# Patient Record
Sex: Female | Born: 1955 | Race: White | Hispanic: No | Marital: Married
Health system: Midwestern US, Academic
[De-identification: ages and names within clinical notes are randomized; demographics above are authoritative.]

---

## 2016-07-29 MED ORDER — AZITHROMYCIN 250 MG PO TAB
ORAL_TABLET | Freq: Every day | 0 refills | Status: DC
Start: 2016-07-29 — End: 2016-08-13

## 2016-07-29 MED ORDER — ALBUTEROL SULFATE 90 MCG/ACTUATION IN HFAA
2 | RESPIRATORY_TRACT | 0 refills | Status: DC | PRN
Start: 2016-07-29 — End: 2016-08-18

## 2016-07-29 MED ORDER — ALBUTEROL SULFATE 2.5 MG /3 ML (0.083 %) IN NEBU
2.5 mg | Freq: Once | RESPIRATORY_TRACT | 0 refills | Status: CP
Start: 2016-07-29 — End: ?

## 2016-07-29 MED ORDER — PREDNISONE 20 MG PO TAB
ORAL_TABLET | Freq: Every day | 0 refills | Status: DC
Start: 2016-07-29 — End: 2016-08-13

## 2016-08-13 MED ORDER — TIZANIDINE 4 MG PO TAB
ORAL_TABLET | Freq: Every evening | 5 refills | Status: DC
Start: 2016-08-13 — End: 2017-04-28

## 2016-08-26 MED ORDER — SODIUM CHLORIDE 0.9 % IV SOLP
1000 mL | INTRAVENOUS | 0 refills | Status: DC
Start: 2016-08-26 — End: 2016-08-26

## 2016-08-26 MED ORDER — FENTANYL CITRATE (PF) 50 MCG/ML IJ SOLN
0 refills | Status: DC
Start: 2016-08-26 — End: 2016-08-26

## 2016-08-26 MED ORDER — PROPOFOL INJ 10 MG/ML IV VIAL
0 refills | Status: DC
Start: 2016-08-26 — End: 2016-08-26

## 2016-08-26 MED ORDER — KETOROLAC 30 MG/ML (1 ML) IJ SOLN
0 refills | Status: DC
Start: 2016-08-26 — End: 2016-08-26

## 2016-08-26 MED ORDER — HYDROCODONE-ACETAMINOPHEN 5-325 MG PO TAB
1 | ORAL_TABLET | ORAL | 0 refills | 30.00000 days | Status: DC | PRN
Start: 2016-08-26 — End: 2016-12-29

## 2016-08-26 MED ORDER — LIDOCAINE (PF) 200 MG/10 ML (2 %) IJ SYRG
0 refills | Status: DC
Start: 2016-08-26 — End: 2016-08-26

## 2016-08-26 MED ORDER — IBUPROFEN 600 MG PO TAB
600 mg | ORAL_TABLET | ORAL | 0 refills | Status: DC | PRN
Start: 2016-08-26 — End: 2019-03-18

## 2016-08-26 MED ORDER — HALOPERIDOL LACTATE 5 MG/ML IJ SOLN
1 mg | Freq: Once | INTRAVENOUS | 0 refills | Status: CP | PRN
Start: 2016-08-26 — End: ?

## 2016-08-26 MED ORDER — DEXAMETHASONE SODIUM PHOSPHATE 4 MG/ML IJ SOLN
INTRAVENOUS | 0 refills | Status: DC
Start: 2016-08-26 — End: 2016-08-26

## 2016-08-26 MED ORDER — LIDOCAINE (PF) 10 MG/ML (1 %) IJ SOLN
.1-2 mL | INTRAMUSCULAR | 0 refills | Status: DC | PRN
Start: 2016-08-26 — End: 2016-08-26

## 2016-08-26 MED ORDER — MIDAZOLAM 1 MG/ML IJ SOLN
INTRAVENOUS | 0 refills | Status: DC
Start: 2016-08-26 — End: 2016-08-26

## 2016-08-26 MED ORDER — PHENYLEPHRINE IN 0.9% NACL(PF) 1 MG/10 ML (100 MCG/ML) IV SYRG
INTRAVENOUS | 0 refills | Status: DC
Start: 2016-08-26 — End: 2016-08-26

## 2016-08-26 MED ORDER — ACETAMINOPHEN 500 MG PO TAB
1000 mg | Freq: Once | ORAL | 0 refills | Status: CP
Start: 2016-08-26 — End: ?

## 2016-08-26 MED ORDER — FENTANYL CITRATE (PF) 50 MCG/ML IJ SOLN
50 ug | INTRAVENOUS | 0 refills | Status: DC | PRN
Start: 2016-08-26 — End: 2016-08-26

## 2016-08-26 MED ORDER — ONDANSETRON HCL (PF) 4 MG/2 ML IJ SOLN
INTRAVENOUS | 0 refills | Status: DC
Start: 2016-08-26 — End: 2016-08-26

## 2016-08-26 MED ORDER — DEXTRAN 70-HYPROMELLOSE 0.1-0.3 % OP DROP
0 refills | Status: DC
Start: 2016-08-26 — End: 2016-08-26

## 2016-08-26 MED ORDER — OXYCODONE 5 MG PO TAB
5 mg | Freq: Once | ORAL | 0 refills | Status: CP | PRN
Start: 2016-08-26 — End: ?

## 2016-09-08 ENCOUNTER — Encounter: Admit: 2016-09-08 | Discharge: 2016-09-08 | Payer: BC Managed Care – HMO

## 2016-09-08 MED ORDER — ZOLPIDEM 5 MG PO TAB
ORAL_TABLET | Freq: Every evening | ORAL | 0 refills | 30.00000 days | Status: AC | PRN
Start: 2016-09-08 — End: 2016-11-12

## 2016-09-08 NOTE — Telephone Encounter
Approved #[id number] no refill as phone in

## 2016-09-08 NOTE — Telephone Encounter
Medication refill request from [facility] for zolpidem.  Last refill was 05/15/2016 (#30 X1).   Last office visit was 08/13/2016, next office visit is 11/27/2016.   Medication is not under standing order protocol.   Routing to Dr. Jonette Mate for approval.    Jonathon Bellows, LPN

## 2016-09-09 NOTE — Telephone Encounter
Prescription phoned into patients [facility].  Yaa Donnellan Stehle, LPN

## 2016-09-10 ENCOUNTER — Encounter: Admit: 2016-09-10 | Discharge: 2016-09-10 | Payer: BC Managed Care – HMO

## 2016-09-12 NOTE — Telephone Encounter
Medication refill request from [facility] for xarelto.  Last refill was 03/17/2016 (#90 X1).   Last office visit was 08/13/2016, next office visit is 11/27/2016.   Medication is not under standing order protocol.   Routing to Dr. Jonette Mate for approval.    Jonathon Bellows, LPN

## 2016-09-13 MED ORDER — XARELTO 20 MG PO TAB
ORAL_TABLET | Freq: Every day | ORAL | 1 refills | 30.00000 days | Status: AC
Start: 2016-09-13 — End: 2017-03-13

## 2016-09-13 NOTE — Telephone Encounter
approved

## 2016-09-18 ENCOUNTER — Emergency Department
Admit: 2016-09-18 | Discharge: 2016-09-19 | Disposition: A | Discharge status: Home / Self Care | Payer: BC Managed Care – HMO

## 2016-09-18 ENCOUNTER — Encounter: Admit: 2016-09-18 | Discharge: 2016-09-18 | Payer: BC Managed Care – HMO

## 2016-09-18 DIAGNOSIS — G43909 Migraine, unspecified, not intractable, without status migrainosus: ICD-10-CM

## 2016-09-18 DIAGNOSIS — M1990 Unspecified osteoarthritis, unspecified site: ICD-10-CM

## 2016-09-18 DIAGNOSIS — H04123 Dry eye syndrome of bilateral lacrimal glands: ICD-10-CM

## 2016-09-18 DIAGNOSIS — I82409 Acute embolism and thrombosis of unspecified deep veins of unspecified lower extremity: ICD-10-CM

## 2016-09-18 DIAGNOSIS — G35 Multiple sclerosis: ICD-10-CM

## 2016-09-18 DIAGNOSIS — H47299 Other optic atrophy, unspecified eye: Principal | ICD-10-CM

## 2016-09-18 DIAGNOSIS — G4733 Obstructive sleep apnea (adult) (pediatric): ICD-10-CM

## 2016-09-18 DIAGNOSIS — K219 Gastro-esophageal reflux disease without esophagitis: ICD-10-CM

## 2016-09-18 DIAGNOSIS — E785 Hyperlipidemia, unspecified: ICD-10-CM

## 2016-09-18 DIAGNOSIS — R011 Cardiac murmur, unspecified: ICD-10-CM

## 2016-09-18 DIAGNOSIS — R51 Headache: ICD-10-CM

## 2016-09-18 MED ORDER — CAFFEINE 200 MG PO TAB
200 mg | Freq: Once | ORAL | 0 refills | Status: DC
Start: 2016-09-18 — End: 2016-09-19

## 2016-09-18 MED ORDER — ACETAMINOPHEN 500 MG PO TAB
1000 mg | Freq: Once | ORAL | 0 refills | Status: CP
Start: 2016-09-18 — End: ?
  Administered 2016-09-19: 01:00:00 1000 mg via ORAL

## 2016-09-18 MED ORDER — FLUORESCEIN-PROPARACAINE OP DROP
1 [drp] | Freq: Once | OPHTHALMIC | 0 refills | Status: CP
Start: 2016-09-18 — End: ?
  Administered 2016-09-19: 1 [drp] via OPHTHALMIC

## 2016-09-18 MED ORDER — SODIUM CHLORIDE 0.9 % IV SOLP
1000 mL | INTRAVENOUS | 0 refills | Status: CP
Start: 2016-09-18 — End: ?
  Administered 2016-09-19: 02:00:00 1000 mL via INTRAVENOUS

## 2016-09-18 MED ORDER — PROCHLORPERAZINE EDISYLATE 5 MG/ML IJ SOLN
10 mg | Freq: Once | INTRAVENOUS | 0 refills | Status: CP
Start: 2016-09-18 — End: ?
  Administered 2016-09-19: 02:00:00 10 mg via INTRAVENOUS

## 2016-09-18 NOTE — ED Notes
Pt to ED for evaluation of a right sided headache that started this morning accompanied with a blood shot eye. Pt denies fall, injury, cough, fevers, SP, SOB. Pt denies being outside today or starting any new medications, food, or eye drops. Pt denies putting on eye makeup. Pt has redness on the white of her right eye and slight swelling under her eye. Pt alert and oriented, breathing non labored and even, lungs CTA, skin color appropriate to age and ethnicity. Pupils round and reactive. Pt resting on bed, call light within reach, awaiting MD evaluation at this time.     Belongings: purse, shirt, pants, socks, shoes, glasses. Pt denies RN documenting any other belongings at this time.

## 2016-09-19 DIAGNOSIS — G43809 Other migraine, not intractable, without status migrainosus: Principal | ICD-10-CM

## 2016-09-19 NOTE — ED Notes
Pt given discharge instructions and denies any questions or concerns at this time.

## 2016-09-19 NOTE — Progress Notes
A. Endometrial curettings:    Benign endocervical glandular mucosa with rare atypical cells. See   comment.     Comment:   Immunohistochemical stain show the atypical cells are negative/weak   positive for p16. No definite dysplasia or malignancy identified. 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     The patient'srecent Pap smear ([id number]) with an interpretation of   "negative for intraepithelial lesion or malignancy" is noted; the findings   in the current ECC specimen correlate with the Pap smear result.     Pursuant to the Quality Assurance Program at the [facility] Pathology Department, selected slides from this case have been   concurrently reviewed by the following pathologist: Dr. Carmon Ginsberg who agrees   with the final diagnosis.      Attestation:   By this signature, I attest that I have personally formulated the final   interpretation expressed in this report and that the above diagnosis is   based upon my examination of the slides and/or other material indicated in   this report.     +++Electronically Signed Out By Zachary George, MD, PhD on 08/28/2016+++          ksw/08/27/2016

## 2016-09-22 ENCOUNTER — Encounter: Admit: 2016-09-22 | Discharge: 2016-09-22 | Payer: BC Managed Care – HMO

## 2016-09-22 NOTE — Telephone Encounter
ED Discharge Follow Up  Reached patient: No; LM to contact PCP office with any questions/concerns, or to make f/u appt.  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 No, routine appt 11/27/16  Specialist appointment scheduled? No  Both PCP and Specialist appointment scheduled: No

## 2016-11-12 ENCOUNTER — Encounter: Admit: 2016-11-12 | Discharge: 2016-11-12 | Payer: BC Managed Care – HMO

## 2016-11-12 MED ORDER — ZOLPIDEM 5 MG PO TAB
5 mg | ORAL_TABLET | Freq: Every evening | ORAL | 0 refills | 30.00000 days | Status: AC | PRN
Start: 2016-11-12 — End: 2017-07-02

## 2016-11-12 MED ORDER — ATORVASTATIN 10 MG PO TAB
10 mg | ORAL_TABLET | Freq: Every evening | ORAL | 1 refills | Status: AC
Start: 2016-11-12 — End: 2017-05-25

## 2016-11-12 NOTE — Telephone Encounter
Refill request received from pt for atorvastatin.  Last refill was 05/19/2016 ( #90 x1).  This medication is under standing order protocol.     Hepatic Function    Lab Results   Component Value Date/Time    ALBUMIN 4.1 08/16/2015 05:51 PM    TOTPROT 6.9 08/16/2015 05:51 PM    ALKPHOS 92 08/16/2015 05:51 PM    Lab Results   Component Value Date/Time    AST 18 08/16/2015 05:51 PM    ALT 13 08/16/2015 05:51 PM    TOTBILI 0.4 08/16/2015 05:51 PM        Medication refill request from pt for zolpidem.  Last refill was 09/08/2016 (#30 X0).   Last office visit was 08/13/2016, next office visit is 11/27/2016.   Medication is not under standing order protocol.   Routing to Dr. Jonette Mate for approval.    Jonathon Bellows, LPN

## 2016-11-12 NOTE — Telephone Encounter
approved

## 2016-11-13 NOTE — Telephone Encounter
Prescription phoned into patients [facility] pharmacy.  Shaunn Tackitt Stehle, LPN

## 2016-11-27 ENCOUNTER — Encounter: Admit: 2016-11-27 | Discharge: 2016-11-27 | Payer: BC Managed Care – HMO

## 2016-11-27 ENCOUNTER — Ambulatory Visit: Admit: 2016-11-27 | Discharge: 2016-11-27 | Payer: BC Managed Care – HMO

## 2016-11-27 DIAGNOSIS — I82409 Acute embolism and thrombosis of unspecified deep veins of unspecified lower extremity: ICD-10-CM

## 2016-11-27 DIAGNOSIS — H04123 Dry eye syndrome of bilateral lacrimal glands: ICD-10-CM

## 2016-11-27 DIAGNOSIS — H47299 Other optic atrophy, unspecified eye: Principal | ICD-10-CM

## 2016-11-27 DIAGNOSIS — K219 Gastro-esophageal reflux disease without esophagitis: ICD-10-CM

## 2016-11-27 DIAGNOSIS — N858 Other specified noninflammatory disorders of uterus: ICD-10-CM

## 2016-11-27 DIAGNOSIS — Z0000 Encounter for general adult medical examination without abnormal findings: ICD-10-CM

## 2016-11-27 DIAGNOSIS — R011 Cardiac murmur, unspecified: ICD-10-CM

## 2016-11-27 DIAGNOSIS — G35 Multiple sclerosis: Principal | ICD-10-CM

## 2016-11-27 DIAGNOSIS — E785 Hyperlipidemia, unspecified: ICD-10-CM

## 2016-11-27 DIAGNOSIS — G43909 Migraine, unspecified, not intractable, without status migrainosus: ICD-10-CM

## 2016-11-27 DIAGNOSIS — M1990 Unspecified osteoarthritis, unspecified site: ICD-10-CM

## 2016-11-27 DIAGNOSIS — G4733 Obstructive sleep apnea (adult) (pediatric): ICD-10-CM

## 2016-11-27 NOTE — Progress Notes
Subjective:       History of Present Illness  Sandra Ortega is a 61 y.o. female here for follow up      Current concerns:  - has a mammogram next Thursday, mobile unit which come to her work.  - no change with MS.  Seeing Dr. Burnadette Peter. Gets throbbing in her leg and takes Ambien at night if needed.  Does not take the Ambien nightly.  Some nights takes OTC sleep aid.  Does not think this is an insomnia problem, more related to the leg pain, throbbing in her leg.  If she does not get a good night of sleep, she feels bad during the day.    - does quite a bit of walking.  Uses an app that tracks diet and exercise.    - lost 5-6 pounds through diet and exercise.  - eye issues are unrelated to MS, getting care through ophthalmology      Recent history:  - in the ED 09/18/16 with red eye and headache. Had thorough evaluation that included slit lamp, fluorescein, ophthalmology evaluation.possible vitreous irritation.  - multiple sclerosis, not currently on immunosuppressive medications.   - recurrent DVT, on Xarelto  - endometrial polyp, had polypectomy.      Social: Husband is anticipating surgery soon      HCM  - PE  - shingles info  - HIV         Review of Systems  Negative for chest pain, shortness of breath, abdominal pain, difficulty urinating.  Positive for leg pain.  Positive for fatigue.  Positive for some weakness.    Objective:         ??? aspirin/acetaminophen/caffeine(+) (EXCEDRIN MIGRAINE) 250/250/65 mg tab Take 2 Tabs by mouth daily as needed (migraines).   ??? atorvastatin (LIPITOR) 10 mg tablet Take 1 tablet by mouth at bedtime daily.   ??? baclofen (LIORESAL) 10 mg tablet TAKE 1 TABLET BY MOUTH 4 TIMES A DAY (Patient taking differently: take one daily if needed)   ??? calcium carbonate/vitamin D-3 (OSCAL-500+D) 1250 mg/200 unit tablet Take 1 tablet by mouth twice daily. Calcium Carb 1250mg  delivers 500mg  elemental Ca   ??? Carboxymethylcellulose Sodium (REFRESH LIQUIGEL) 1 % dlgl Apply 1 drop to both eyes twice daily.   ??? Cholecalciferol (Vitamin D3) 2,000 unit cap Take 2,000 Units by mouth at bedtime daily.   ??? fish oil- omega 3-DHA/EPA 300/1,000 mg capsule Take 1 capsule by mouth at bedtime daily.   ??? HYDROcodone/acetaminophen (NORCO) 5/325 mg tablet Take 1 tablet by mouth every 4 hours as needed for Pain   ??? ibuprofen (MOTRIN) 600 mg tablet Take 1 tablet by mouth every 6 hours as needed for Pain. Take with food.   ??? lactulose 10 gram/15 mL oral solution Take 15 mL by mouth daily as needed.   ??? pantoprazole DR (PROTONIX) 40 mg tablet Take 1 tablet by mouth twice daily. (Patient taking differently: Take 40 mg by mouth at bedtime daily.)   ??? prednisolone acetate (PRED FORTE) 1 % ophthalmic suspension INSTILL 1 DROP INTO RIGHT EYE 4X /DAY X 1 WK, 3X /DAY X 1 WK, TWICE DAILY X 1 WK, ONCE DAILY X 1 WK   ??? tiZANidine (ZANAFLEX) 4 mg tablet TAKE 2 TABLETS BY MOUTH AT BEDTIME   ??? XARELTO 20 mg tablet TAKE 1 TABLET BY MOUTH EVERY DAY   ??? zolpidem (AMBIEN) 5 mg tablet Take 1 tablet by mouth at bedtime as needed for Sleep.     Vitals:  11/27/16 0813   BP: 116/78   Pulse: 80   Temp: 36.7 ???C (98.1 ???F)   TempSrc: Oral   Weight: 101.9 kg (224 lb 9.6 oz)   Height: 162.6 cm (64.02)     Body mass index is 38.53 kg/m???.     Physical Exam  Looks well, no distress.  Able to get up on the exam table without difficulty.  Blood pressure 116/78.  Lungs sound clear, heart regular abdomen is soft and nontender.  Extremities are not edematous.  Good range of motion of her legs.  No acute synovitis.  Strength is equal.       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  ???Clinically stable, not on immunosuppressive therapy at present.  Followed with Dr. Burnadette Peter.  ???Some fatigue and throbbing pain in her right leg which may be due to a combination of the MS and the, sciatica..  Seems to do fairly well with occasional Ambien, tizanidine or baclofen.  Takes all of these medications very sparingly.     Recurrent Deep Vein Thrombosis (Dvt) (Hcc)    11/22/15 getting records, but patient thinks she was told she needs life long anticoagulation for two prior DVT of the right leg. On Xarelto.  08/13/16 clinically stable.  Anticoagulated with Xarelto.  Had a little bit of bleeding in her mouth during the night a couple times but currently there are no abnormalities seen on her mucous membranes.  Will continue to follow.  Discussed perioperative management of her Xarelto for upcoming gynecologic procedure.  today  doing well with the Xarelto.  Continue same.     Uterine Mass    - 2015 MRI of L-spine possible uterine lesion  05/16/16 reviewed images with patient.  Pelvic sono ordered.  08/13/16 has endometrial polyp and having hysteroscope, D&C, polypectomy soon.  Discussed need to hold Xarelto for the 2 days prior to the procedure as well as the day of the procedure.  I did not think we needed any bridging Lovenox.  It has been a number of years since her last DVT.  today  reviewed the notes from gynecology.  Had a hysteroscope a few months ago and did well with the procedure.  Biopsy showed a few atypical cells.  Needs follow-up with gynecology in May.  This was discussed with the patient.           Insomnia/right leg pain,  - continue same. Doing ok with occasional Ambien, tizanidine or baclofen.      RTC 6 months and prn

## 2016-12-11 ENCOUNTER — Encounter: Admit: 2016-12-11 | Discharge: 2016-12-11 | Payer: BC Managed Care – HMO

## 2016-12-29 ENCOUNTER — Encounter: Admit: 2016-12-29 | Discharge: 2016-12-29 | Payer: BC Managed Care – HMO

## 2016-12-29 ENCOUNTER — Ambulatory Visit: Admit: 2016-12-29 | Discharge: 2016-12-29 | Payer: BC Managed Care – HMO

## 2016-12-29 DIAGNOSIS — H04123 Dry eye syndrome of bilateral lacrimal glands: ICD-10-CM

## 2016-12-29 DIAGNOSIS — G35 Multiple sclerosis: ICD-10-CM

## 2016-12-29 DIAGNOSIS — H47299 Other optic atrophy, unspecified eye: Principal | ICD-10-CM

## 2016-12-29 DIAGNOSIS — G43909 Migraine, unspecified, not intractable, without status migrainosus: ICD-10-CM

## 2016-12-29 DIAGNOSIS — M79605 Pain in left leg: Principal | ICD-10-CM

## 2016-12-29 DIAGNOSIS — G4733 Obstructive sleep apnea (adult) (pediatric): ICD-10-CM

## 2016-12-29 DIAGNOSIS — K219 Gastro-esophageal reflux disease without esophagitis: ICD-10-CM

## 2016-12-29 DIAGNOSIS — M1990 Unspecified osteoarthritis, unspecified site: ICD-10-CM

## 2016-12-29 DIAGNOSIS — R011 Cardiac murmur, unspecified: ICD-10-CM

## 2016-12-29 DIAGNOSIS — I82409 Acute embolism and thrombosis of unspecified deep veins of unspecified lower extremity: ICD-10-CM

## 2016-12-29 DIAGNOSIS — E785 Hyperlipidemia, unspecified: ICD-10-CM

## 2016-12-29 NOTE — Progress Notes
CC: leg pain     History of Present Illness  Sandra Ortega is a 61 y.o. female who presents with one-week history of left sided leg pain.  It started behind the left knee.  She does not recall any inciting event.  She does have radiation towards her foot and the back of the calf.  It comes and goes.  It is worse with walking up the stairs.  She has done some at home remedies including heat, ice, ibuprofen with moderate relief.  She is very concerned she might have a blood clot.  She does have a history of DVT in the right leg for which she does take Xarelto.  She is very religious about her medication and does not miss any doses.  She does have intermittent clicking in the left knee as well.  She denies any chest pain or shortness of breath.    Of note her daughter who is becoming a physician is away for a rotation and she is caring for her 3 grandchildren.  Their father is a Emergency planning/management officer that works in the nights.     Review of Systems   Constitutional: Negative for fatigue.   Eyes: Negative for visual disturbance.   Respiratory: Negative for cough and shortness of breath.    Cardiovascular: Negative for chest pain.   Musculoskeletal: Positive for arthralgias and gait problem. Negative for joint swelling.   Skin: Negative for color change and rash.   Neurological: Negative for dizziness, syncope, weakness, light-headedness, numbness and headaches.   Psychiatric/Behavioral: Negative for dysphoric mood.         Objective:         ??? aspirin/acetaminophen/caffeine(+) (EXCEDRIN MIGRAINE) 250/250/65 mg tab Take 2 Tabs by mouth daily as needed (migraines).   ??? atorvastatin (LIPITOR) 10 mg tablet Take 1 tablet by mouth at bedtime daily.   ??? baclofen (LIORESAL) 10 mg tablet TAKE 1 TABLET BY MOUTH 4 TIMES A DAY (Patient taking differently: take one daily if needed)   ??? calcium carbonate/vitamin D-3 (OSCAL-500+D) 1250 mg/200 unit tablet Take 1 tablet by mouth twice daily. Calcium Carb 1250mg  delivers 500mg  elemental Ca ??? Carboxymethylcellulose Sodium (REFRESH LIQUIGEL) 1 % dlgl Apply 1 drop to both eyes twice daily.   ??? Cholecalciferol (Vitamin D3) 2,000 unit cap Take 2,000 Units by mouth at bedtime daily.   ??? fish oil- omega 3-DHA/EPA 300/1,000 mg capsule Take 1 capsule by mouth at bedtime daily.   ??? ibuprofen (MOTRIN) 600 mg tablet Take 1 tablet by mouth every 6 hours as needed for Pain. Take with food.   ??? lactulose 10 gram/15 mL oral solution Take 15 mL by mouth daily as needed.   ??? pantoprazole DR (PROTONIX) 40 mg tablet Take 1 tablet by mouth twice daily. (Patient taking differently: Take 40 mg by mouth at bedtime daily.)   ??? prednisolone acetate (PRED FORTE) 1 % ophthalmic suspension INSTILL 1 DROP INTO RIGHT EYE 4X /DAY X 1 WK, 3X /DAY X 1 WK, TWICE DAILY X 1 WK, ONCE DAILY X 1 WK   ??? tiZANidine (ZANAFLEX) 4 mg tablet TAKE 2 TABLETS BY MOUTH AT BEDTIME   ??? XARELTO 20 mg tablet TAKE 1 TABLET BY MOUTH EVERY DAY   ??? zolpidem (AMBIEN) 5 mg tablet Take 1 tablet by mouth at bedtime as needed for Sleep.     Vitals:    12/29/16 1105   BP: 134/82   Pulse: 82   Resp: 16   Temp: 36.8 ???C (98.3 ???F)  TempSrc: Oral   Weight: 101.6 kg (224 lb)   Height: 162.6 cm (64.02)         Body mass index is 38.43 kg/m???.     Physical Exam   Constitutional: She is oriented to person, place, and time. She appears well-developed and well-nourished. No distress.   HENT:   Head: Normocephalic and atraumatic.   Mouth/Throat: No oropharyngeal exudate.   Eyes: Conjunctivae and EOM are normal. No scleral icterus.   Neck: Normal range of motion. Neck supple.   Cardiovascular: Normal rate, regular rhythm, normal heart sounds and intact distal pulses.    No murmur heard.  Pulmonary/Chest: Effort normal and breath sounds normal. She has no wheezes.   Abdominal: Soft. Bowel sounds are normal. She exhibits no distension.   Musculoskeletal: Normal range of motion. She exhibits no edema.        Legs:  2 bruises as described  -no swelling, erythema or rash Lymphadenopathy:     She has no cervical adenopathy.   Neurological: She is alert and oriented to person, place, and time. She exhibits normal muscle tone.   Skin: Skin is warm and dry. She is not diaphoretic. No erythema.   Psychiatric: She has a normal mood and affect.       Labwork reviewed:  Lab Results   Component Value Date/Time    HGBA1C 5.9 12/07/2015 08:20 AM    HGBA1C 5.9 08/16/2015 05:51 PM    TSH 1.762 08/16/2015 05:51 PM    CHOL 146 12/07/2015 08:20 AM    TRIG 90 12/07/2015 08:20 AM    HDL 53 12/07/2015 08:20 AM    LDL 77 12/07/2015 08:20 AM    NA 141 08/18/2016 12:15 PM    K 4.0 08/18/2016 12:15 PM    CL 110 08/18/2016 12:15 PM    CO2 24 08/18/2016 12:15 PM    GAP 7 08/18/2016 12:15 PM    BUN 20 08/18/2016 12:15 PM    CR 1.02 (H) 08/18/2016 12:15 PM    GLU 92 08/18/2016 12:15 PM    CA 9.5 08/18/2016 12:15 PM    PO4 3.3 01/24/2015 08:22 PM    ALBUMIN 4.1 08/16/2015 05:51 PM    TOTPROT 6.9 08/16/2015 05:51 PM    ALKPHOS 92 08/16/2015 05:51 PM    AST 18 08/16/2015 05:51 PM    ALT 13 08/16/2015 05:51 PM    TOTBILI 0.4 08/16/2015 05:51 PM    GFR 55 (L) 08/18/2016 12:15 PM    GFRAA >60 08/18/2016 12:15 PM            Assessment and Plan:  1. Left leg pain  Sandra Ortega is a 61 year old female presenting for an urgent care visit with one-week history of left posterior knee and calf pain.  Is exacerbated with movement.  She has been doing some over-the-counter remedies with mild relief.  Her major concern today is that she did not have a blood clot.  Based on exam and medication history I attempted to reassure patient that it is very unlikely that she has a DVT in the left posterior knee calf area.  Recommended that she continue her conservative management with rest ice elevation and NSAIDs.  It is possible that she has had rupture of a Baker's cyst, has a muscle strain or run-of-the-mill osteoarthritis in that left knee that is causing aggravation.  She will continue to manage her symptoms and if not improving over the next week will reach back out for possible imaging.  RTC PRN     Landry Mellow, MD                             Patient Instructions   Thank you for coming in today:    I do not think you have a blood clot.  I would recommend ongoing rest, ice and ibuprofen.     If not improving in another week reach out and we can take an Xray    Landry Mellow, MD        No Follow-up on file.

## 2017-01-02 ENCOUNTER — Encounter: Admit: 2017-01-02 | Discharge: 2017-01-02 | Payer: BC Managed Care – HMO

## 2017-01-02 DIAGNOSIS — E559 Vitamin D deficiency, unspecified: Principal | ICD-10-CM

## 2017-01-02 NOTE — Telephone Encounter
Pt LVM stating she would like to proceed with an xray due to her leg pain. Per Dr. Alver Fisher LOV note:    Assessment and Plan:  1. Left leg pain  Sandra Ortega is a 61 year old female presenting for an urgent care visit with one-week history of left posterior knee and calf pain.  Is exacerbated with movement.  She has been doing some over-the-counter remedies with mild relief.  Her major concern today is that she did not have a blood clot.  Based on exam and medication history I attempted to reassure patient that it is very unlikely that she has a DVT in the left posterior knee calf area.  Recommended that she continue her conservative management with rest ice elevation and NSAIDs.  It is possible that she has had rupture of a Baker's cyst, has a muscle strain or run-of-the-mill osteoarthritis in that left knee that is causing aggravation.  She will continue to manage her symptoms and if not improving over the next week will reach back out for possible imaging.    Routing to Dr. Jonette Mate for approval.   Jonathon Bellows, LPN

## 2017-01-05 NOTE — Telephone Encounter
I called and left a message for Sandra Ortega - I would like to talk to her prior to ordering an x-ray so I order the best possible test.

## 2017-01-07 NOTE — Telephone Encounter
I tried calling again.  LM and asked her to call you, Sandra Ortega.  Based on the notes, I'm not sure what imaging to order.  I could see her this evening if Sandra Ortega would like.

## 2017-01-08 NOTE — Telephone Encounter
LVM requesting appointment with Dr.McGreevy to evaluate for best possible imaging order. Provided number to scheduling and requested a return call with any questions or concern.   Sandra Bellows, LPN

## 2017-01-12 ENCOUNTER — Encounter: Admit: 2017-01-12 | Discharge: 2017-01-12 | Payer: BC Managed Care – HMO

## 2017-01-12 MED ORDER — PANTOPRAZOLE 40 MG PO TBEC
40 mg | ORAL_TABLET | Freq: Every evening | ORAL | 1 refills | 90.00000 days | Status: AC
Start: 2017-01-12 — End: 2017-07-20

## 2017-01-12 NOTE — Telephone Encounter
Refill request received from pt for pantoprazole.  Last refill was 01/08/2016 ( #180 x1).  Last visit was 12/29/2016, next visit is 05/21/2017.  This medication is under standing order protocol.   Jonathon Bellows, LPN

## 2017-01-14 ENCOUNTER — Ambulatory Visit: Admit: 2017-01-14 | Discharge: 2017-01-14 | Payer: BC Managed Care – HMO

## 2017-01-14 ENCOUNTER — Encounter: Admit: 2017-01-14 | Discharge: 2017-01-14 | Payer: BC Managed Care – HMO

## 2017-01-14 DIAGNOSIS — M1990 Unspecified osteoarthritis, unspecified site: ICD-10-CM

## 2017-01-14 DIAGNOSIS — K219 Gastro-esophageal reflux disease without esophagitis: ICD-10-CM

## 2017-01-14 DIAGNOSIS — M79605 Pain in left leg: Principal | ICD-10-CM

## 2017-01-14 DIAGNOSIS — I82409 Acute embolism and thrombosis of unspecified deep veins of unspecified lower extremity: ICD-10-CM

## 2017-01-14 DIAGNOSIS — E785 Hyperlipidemia, unspecified: ICD-10-CM

## 2017-01-14 DIAGNOSIS — G4733 Obstructive sleep apnea (adult) (pediatric): ICD-10-CM

## 2017-01-14 DIAGNOSIS — R011 Cardiac murmur, unspecified: ICD-10-CM

## 2017-01-14 DIAGNOSIS — G35 Multiple sclerosis: ICD-10-CM

## 2017-01-14 DIAGNOSIS — G43909 Migraine, unspecified, not intractable, without status migrainosus: ICD-10-CM

## 2017-01-14 DIAGNOSIS — H04123 Dry eye syndrome of bilateral lacrimal glands: ICD-10-CM

## 2017-01-14 DIAGNOSIS — H47299 Other optic atrophy, unspecified eye: Principal | ICD-10-CM

## 2017-01-14 NOTE — Progress Notes
Subjective:       History of Present Illness  Sandra Ortega is a 61 y.o. female here for leg pain    - had a sharp pain in the back of hte left knee that radiates down the left leg, about 3 weeks of pain.  - saw Dr. Alver Fisher  - no swelling. Weight bearing causes pain, movement causes pain.  Able to walk, but using a cane. Staying abou the same, not much improvement  ???Has a history of 2 DVT in the right leg.  She is not sure if she was on anticoagulation when she had the second DVT.       Review of Systems  Negative for chest pain, shortness of breath.  Has chronic right leg weakness, no recent change.  Positive for pain in the left knee that radiates down to the calf.    Objective:         ??? aspirin/acetaminophen/caffeine(+) (EXCEDRIN MIGRAINE) 250/250/65 mg tab Take 2 Tabs by mouth daily as needed (migraines).   ??? atorvastatin (LIPITOR) 10 mg tablet Take 1 tablet by mouth at bedtime daily.   ??? baclofen (LIORESAL) 10 mg tablet TAKE 1 TABLET BY MOUTH 4 TIMES A DAY (Patient taking differently: take one daily if needed)   ??? calcium carbonate/vitamin D-3 (OSCAL-500+D) 1250 mg/200 unit tablet Take 1 tablet by mouth twice daily. Calcium Carb 1250mg  delivers 500mg  elemental Ca   ??? Carboxymethylcellulose Sodium (REFRESH LIQUIGEL) 1 % dlgl Apply 1 drop to both eyes twice daily.   ??? Cholecalciferol (Vitamin D3) 2,000 unit cap Take 2,000 Units by mouth at bedtime daily.   ??? fish oil- omega 3-DHA/EPA 300/1,000 mg capsule Take 1 capsule by mouth at bedtime daily.   ??? ibuprofen (MOTRIN) 600 mg tablet Take 1 tablet by mouth every 6 hours as needed for Pain. Take with food.   ??? lactulose 10 gram/15 mL oral solution Take 15 mL by mouth daily as needed.   ??? pantoprazole DR (PROTONIX) 40 mg tablet Take one tablet by mouth at bedtime daily.   ??? prednisolone acetate (PRED FORTE) 1 % ophthalmic suspension INSTILL 1 DROP INTO RIGHT EYE 4X /DAY X 1 WK, 3X /DAY X 1 WK, TWICE DAILY X 1 WK, ONCE DAILY X 1 WK ??? tiZANidine (ZANAFLEX) 4 mg tablet TAKE 2 TABLETS BY MOUTH AT BEDTIME   ??? XARELTO 20 mg tablet TAKE 1 TABLET BY MOUTH EVERY DAY   ??? zolpidem (AMBIEN) 5 mg tablet Take 1 tablet by mouth at bedtime as needed for Sleep.     Vitals:    01/14/17 1449   BP: 146/90   Pulse: 76   Resp: 18   Temp: 36.4 ???C (97.6 ???F)   TempSrc: Oral   Weight: 101.7 kg (224 lb 3.2 oz)   Height: 162.6 cm (64.02)     Body mass index is 38.46 kg/m???.     Physical Exam    Looks well, no distress.  Blood pressure 146/90, pulse 76.  Able to get up on the exam table but does move slowly need a little bit of help.  Lungs sound clear and heart is regular.  Abdomen is soft.  The left leg does not appear swollen when compared to the right but both legs are puffy.  There is no pitting edema.  She has fair range of motion of the left knee but some pain with full extension and flexion.  There is no palpable Baker's cyst.  She has varicose veins but no  other visible abnormalities on the skin.  Her dorsalis pedis pulses difficult to feel but she does have a warm left foot.     Assessment and Plan:    Left knee pain and left calf pain  ??? Differential includes osteoarthritis of the knee, Baker's cyst, muscle strain and less likely, but possible DVT.  Since she has a history of recurrent DVT in the right leg I decided to go ahead and get an ultrasound of the left leg.  We can look for DVT as well as a Bakers cyst.  I also ordered an x-ray of the left knee.  Clinically this is more likely a muscle strain or osteoarthritis of the knee.    Multiple sclerosis  ???Clinically stable    History of right leg DVT  ???On Xarelto 20 mg a day and takes it regularly, no missed doses

## 2017-01-21 ENCOUNTER — Encounter: Admit: 2017-01-21 | Discharge: 2017-01-21 | Payer: BC Managed Care – HMO

## 2017-01-21 ENCOUNTER — Ambulatory Visit: Admit: 2017-01-21 | Discharge: 2017-01-21 | Payer: BC Managed Care – HMO

## 2017-01-21 DIAGNOSIS — M79605 Pain in left leg: Principal | ICD-10-CM

## 2017-01-22 ENCOUNTER — Encounter: Admit: 2017-01-22 | Discharge: 2017-01-22 | Payer: BC Managed Care – HMO

## 2017-01-22 DIAGNOSIS — M25562 Pain in left knee: Principal | ICD-10-CM

## 2017-02-04 ENCOUNTER — Ambulatory Visit: Admit: 2017-02-04 | Discharge: 2017-02-05 | Payer: BC Managed Care – HMO

## 2017-02-04 ENCOUNTER — Encounter: Admit: 2017-02-04 | Discharge: 2017-02-04 | Payer: BC Managed Care – HMO

## 2017-02-04 DIAGNOSIS — K219 Gastro-esophageal reflux disease without esophagitis: ICD-10-CM

## 2017-02-04 DIAGNOSIS — G43909 Migraine, unspecified, not intractable, without status migrainosus: ICD-10-CM

## 2017-02-04 DIAGNOSIS — M1990 Unspecified osteoarthritis, unspecified site: ICD-10-CM

## 2017-02-04 DIAGNOSIS — H47299 Other optic atrophy, unspecified eye: Principal | ICD-10-CM

## 2017-02-04 DIAGNOSIS — E785 Hyperlipidemia, unspecified: ICD-10-CM

## 2017-02-04 DIAGNOSIS — H04123 Dry eye syndrome of bilateral lacrimal glands: ICD-10-CM

## 2017-02-04 DIAGNOSIS — R011 Cardiac murmur, unspecified: ICD-10-CM

## 2017-02-04 DIAGNOSIS — G4733 Obstructive sleep apnea (adult) (pediatric): ICD-10-CM

## 2017-02-04 DIAGNOSIS — I82409 Acute embolism and thrombosis of unspecified deep veins of unspecified lower extremity: ICD-10-CM

## 2017-02-04 DIAGNOSIS — G35 Multiple sclerosis: ICD-10-CM

## 2017-02-05 DIAGNOSIS — S86112D Strain of other muscle(s) and tendon(s) of posterior muscle group at lower leg level, left leg, subsequent encounter: Principal | ICD-10-CM

## 2017-02-11 ENCOUNTER — Encounter: Admit: 2017-02-11 | Discharge: 2017-02-11 | Payer: BC Managed Care – HMO

## 2017-02-11 ENCOUNTER — Ambulatory Visit: Admit: 2017-02-11 | Discharge: 2017-02-12 | Payer: BC Managed Care – HMO

## 2017-02-11 DIAGNOSIS — M1990 Unspecified osteoarthritis, unspecified site: ICD-10-CM

## 2017-02-11 DIAGNOSIS — H47299 Other optic atrophy, unspecified eye: Principal | ICD-10-CM

## 2017-02-11 DIAGNOSIS — G35 Multiple sclerosis: ICD-10-CM

## 2017-02-11 DIAGNOSIS — R29898 Other symptoms and signs involving the musculoskeletal system: Secondary | ICD-10-CM

## 2017-02-11 DIAGNOSIS — E785 Hyperlipidemia, unspecified: ICD-10-CM

## 2017-02-11 DIAGNOSIS — G43909 Migraine, unspecified, not intractable, without status migrainosus: ICD-10-CM

## 2017-02-11 DIAGNOSIS — R011 Cardiac murmur, unspecified: ICD-10-CM

## 2017-02-11 DIAGNOSIS — I82409 Acute embolism and thrombosis of unspecified deep veins of unspecified lower extremity: ICD-10-CM

## 2017-02-11 DIAGNOSIS — K219 Gastro-esophageal reflux disease without esophagitis: ICD-10-CM

## 2017-02-11 DIAGNOSIS — H04123 Dry eye syndrome of bilateral lacrimal glands: ICD-10-CM

## 2017-02-11 DIAGNOSIS — G4733 Obstructive sleep apnea (adult) (pediatric): ICD-10-CM

## 2017-02-11 NOTE — Progress Notes
Date of Service: 02/11/2017     Subjective:               History of Present Illness    Sandra Ortega is a 61 y.o. female presenting for follow-up evaluation of a strain of her left lateral gastrocnemius muscle.  I initially evaluated her 1 week ago and recommended she wear a walking boot for a few days to calm down the pain.  She reports she used the boot for about 2 days and now she has had significant improvement in her left leg pain.  As a reminder, she has a history of multiple sclerosis and has chronic right leg weakness that causes an altered gait.  She reports that she now walks approximately at her baseline.       Review of Systems   HENT: Negative.    Eyes: Negative.    Respiratory: Negative.    Cardiovascular: Negative.    Gastrointestinal: Negative.    Endocrine: Negative.    Genitourinary: Negative.    Musculoskeletal: Positive for gait problem.   Skin: Negative.    Allergic/Immunologic: Negative.    Neurological: Positive for weakness.   Hematological: Negative.    Psychiatric/Behavioral: Negative.    All other systems reviewed and are negative.        Objective:         ??? aspirin/acetaminophen/caffeine(+) (EXCEDRIN MIGRAINE) 250/250/65 mg tab Take 2 Tabs by mouth daily as needed (migraines).   ??? atorvastatin (LIPITOR) 10 mg tablet Take 1 tablet by mouth at bedtime daily.   ??? baclofen (LIORESAL) 10 mg tablet TAKE 1 TABLET BY MOUTH 4 TIMES A DAY (Patient taking differently: take one daily if needed)   ??? calcium carbonate/vitamin D-3 (OSCAL-500+D) 1250 mg/200 unit tablet Take 1 tablet by mouth twice daily. Calcium Carb 1250mg  delivers 500mg  elemental Ca   ??? Carboxymethylcellulose Sodium (REFRESH LIQUIGEL) 1 % dlgl Apply 1 drop to both eyes twice daily.   ??? Cholecalciferol (Vitamin D3) 2,000 unit cap Take 2,000 Units by mouth at bedtime daily.   ??? fish oil- omega 3-DHA/EPA 300/1,000 mg capsule Take 1 capsule by mouth at bedtime daily. ??? ibuprofen (MOTRIN) 600 mg tablet Take 1 tablet by mouth every 6 hours as needed for Pain. Take with food.   ??? lactulose 10 gram/15 mL oral solution Take 15 mL by mouth daily as needed.   ??? pantoprazole DR (PROTONIX) 40 mg tablet Take one tablet by mouth at bedtime daily.   ??? prednisolone acetate (PRED FORTE) 1 % ophthalmic suspension INSTILL 1 DROP INTO RIGHT EYE 4X /DAY X 1 WK, 3X /DAY X 1 WK, TWICE DAILY X 1 WK, ONCE DAILY X 1 WK   ??? tiZANidine (ZANAFLEX) 4 mg tablet TAKE 2 TABLETS BY MOUTH AT BEDTIME   ??? XARELTO 20 mg tablet TAKE 1 TABLET BY MOUTH EVERY DAY   ??? zolpidem (AMBIEN) 5 mg tablet Take 1 tablet by mouth at bedtime as needed for Sleep.     Vitals:    02/11/17 1450   BP: 151/77   Pulse: 66   Resp: 16   SpO2: 98%   Weight: 101.6 kg (224 lb)   Height: 162.6 cm (64)     Body mass index is 38.45 kg/m???.     Physical Exam   Constitutional: She is oriented to person, place, and time. She appears well-developed and well-nourished. No distress.   HENT:   Head: Normocephalic and atraumatic.   Cardiovascular: Normal rate and intact distal pulses.  Pulmonary/Chest: Effort normal.   Neurological: She is alert and oriented to person, place, and time.   Skin: She is not diaphoretic.   Psychiatric: She has a normal mood and affect.   Nursing note and vitals reviewed.    Left Knee Exam     Comments:  Left Knee Exam:  Ambulates with antalgic gait much improved form last visit. Not using assistive device.. Obvious weakness of her right leg is persistent  Skin intact.  ROM 0-150 degrees. no effusion , no warmth or erythema.  Stable to varus/valgus stress at 0 and 30 degrees.  Negative lateral jt line tenderness. mild medial jt line tenderness.  Negative McMurray test.  Negative lachman.  Negative anterior drawer.  Negative posterior drawer.  Negative patellar apprehension test.  Positive retropatellar crepitus noted.  Quad muscle tone adequate.  Neurovascularly intact. She is not tender to palpation over distal hamstring attachments and hamstring muscle bellies.  Resisted knee flexion is pain-free with adequate strength.  She is not tender to palpation over proximal gastrocnemius attachments.  She did have pain with single leg toe raises that she located to her lateral gastroc.  She is tender to palpation over the muscle belly of the lateral gastrocnemius muscle.  Nontender over the medial  gastroc.  Achilles is intact.  Negative Thompson test.  No significant soft tissue swelling in her left calf, no palpable cords. No pain on palpation of tibia or fibula, no pain on compression of tibia/fibula. Pulses and sensation to light touch grossly intact distal foot.                 Assessment and Plan:  1. Gastrocnemius strain, left, subsequent encounter  AMB REFERRAL TO PHYSICAL OR OCCUPATIONAL THERAPY   2. Weakness of right lower extremity  AMB REFERRAL TO PHYSICAL OR OCCUPATIONAL THERAPY   61 year old female with a mild strain of her left lateral gastrocnemius muscle that is greatly improved.  She also has an altered gait from chronic weakness of her right leg secondary to multiple sclerosis.  Patient was interested in a physical therapy referral for lower extremity strengthening.  She will start PT now and will return to clinic in 6 weeks as needed.

## 2017-02-12 DIAGNOSIS — S86112D Strain of other muscle(s) and tendon(s) of posterior muscle group at lower leg level, left leg, subsequent encounter: ICD-10-CM

## 2017-02-12 DIAGNOSIS — S86111D Strain of other muscle(s) and tendon(s) of posterior muscle group at lower leg level, right leg, subsequent encounter: Principal | ICD-10-CM

## 2017-03-11 ENCOUNTER — Encounter: Admit: 2017-03-11 | Discharge: 2017-03-11 | Payer: BC Managed Care – HMO

## 2017-03-12 NOTE — Telephone Encounter
Medication refill request from [facility] for xarelto.  Last refill was 09/13/2016 (#90 X1).   Last office visit was 01/14/2017, next office visit is 05/21/2017.   Medication is not under standing order protocol.   Routing to Dr. Jonette Mate for approval.    Jonathon Bellows, LPN

## 2017-03-13 MED ORDER — XARELTO 20 MG PO TAB
ORAL_TABLET | Freq: Every day | ORAL | 1 refills | 30.00000 days | Status: AC
Start: 2017-03-13 — End: 2017-09-01

## 2017-03-13 NOTE — Telephone Encounter
approved

## 2017-03-19 ENCOUNTER — Encounter: Admit: 2017-03-19 | Discharge: 2017-03-19 | Payer: BC Managed Care – HMO

## 2017-03-19 ENCOUNTER — Ambulatory Visit: Admit: 2017-03-19 | Discharge: 2017-03-20 | Payer: BC Managed Care – HMO

## 2017-03-19 DIAGNOSIS — H47299 Other optic atrophy, unspecified eye: Principal | ICD-10-CM

## 2017-03-19 DIAGNOSIS — R011 Cardiac murmur, unspecified: ICD-10-CM

## 2017-03-19 DIAGNOSIS — K219 Gastro-esophageal reflux disease without esophagitis: ICD-10-CM

## 2017-03-19 DIAGNOSIS — J00 Acute nasopharyngitis [common cold]: Principal | ICD-10-CM

## 2017-03-19 DIAGNOSIS — G4733 Obstructive sleep apnea (adult) (pediatric): ICD-10-CM

## 2017-03-19 DIAGNOSIS — H04123 Dry eye syndrome of bilateral lacrimal glands: ICD-10-CM

## 2017-03-19 DIAGNOSIS — E785 Hyperlipidemia, unspecified: ICD-10-CM

## 2017-03-19 DIAGNOSIS — M1990 Unspecified osteoarthritis, unspecified site: ICD-10-CM

## 2017-03-19 DIAGNOSIS — G35 Multiple sclerosis: ICD-10-CM

## 2017-03-19 DIAGNOSIS — I82409 Acute embolism and thrombosis of unspecified deep veins of unspecified lower extremity: ICD-10-CM

## 2017-03-19 DIAGNOSIS — G43909 Migraine, unspecified, not intractable, without status migrainosus: ICD-10-CM

## 2017-03-19 MED ORDER — AZITHROMYCIN 250 MG PO TAB
ORAL_TABLET | Freq: Every day | 0 refills | Status: AC
Start: 2017-03-19 — End: 2017-07-02

## 2017-03-20 NOTE — Progress Notes
Date of Service: 03/19/2017    Sandra Ortega is a 61 y.o. female.  DOB: 1955-10-18  MRN: [id number]     Subjective:             History of Present Illness  61 year old female presents with 4-day history of sore throat cough headache rhinorrhea nasal congestion.  She has had mild fever headaches.  The patient has MS and suffers from frequent chronic headaches.  She is concerned that this will develop into something else and is requesting an antibiotic.       Review of Systems   Constitutional: Positive for fever. Negative for chills and fatigue.   HENT: Positive for congestion, postnasal drip and rhinorrhea.    Respiratory: Positive for cough.    Cardiovascular: Negative for chest pain.   Gastrointestinal: Negative for constipation, diarrhea, nausea and vomiting.   Neurological: Negative for weakness and headaches.         Objective:         ??? aspirin/acetaminophen/caffeine(+) (EXCEDRIN MIGRAINE) 250/250/65 mg tab Take 2 Tabs by mouth daily as needed (migraines).   ??? atorvastatin (LIPITOR) 10 mg tablet Take 1 tablet by mouth at bedtime daily.   ??? azithromycin (ZITHROMAX) 250 mg tablet Take 2 tabs by mouth on day 1, followed by 1 tab by mouth daily on days 2 - 5.   ??? baclofen (LIORESAL) 10 mg tablet TAKE 1 TABLET BY MOUTH 4 TIMES A DAY (Patient taking differently: take one daily if needed)   ??? calcium carbonate/vitamin D-3 (OSCAL-500+D) 1250 mg/200 unit tablet Take 1 tablet by mouth twice daily. Calcium Carb 1250mg  delivers 500mg  elemental Ca   ??? Carboxymethylcellulose Sodium (REFRESH LIQUIGEL) 1 % dlgl Apply 1 drop to both eyes twice daily.   ??? Cholecalciferol (Vitamin D3) 2,000 unit cap Take 2,000 Units by mouth at bedtime daily.   ??? fish oil- omega 3-DHA/EPA 300/1,000 mg capsule Take 1 capsule by mouth at bedtime daily.   ??? ibuprofen (MOTRIN) 600 mg tablet Take 1 tablet by mouth every 6 hours as needed for Pain. Take with food.   ??? lactulose 10 gram/15 mL oral solution Take 15 mL by mouth daily as needed. ??? pantoprazole DR (PROTONIX) 40 mg tablet Take one tablet by mouth at bedtime daily.   ??? prednisolone acetate (PRED FORTE) 1 % ophthalmic suspension INSTILL 1 DROP INTO RIGHT EYE 4X /DAY X 1 WK, 3X /DAY X 1 WK, TWICE DAILY X 1 WK, ONCE DAILY X 1 WK   ??? tiZANidine (ZANAFLEX) 4 mg tablet TAKE 2 TABLETS BY MOUTH AT BEDTIME   ??? XARELTO 20 mg tablet TAKE 1 TABLET BY MOUTH EVERY DAY   ??? zolpidem (AMBIEN) 5 mg tablet Take 1 tablet by mouth at bedtime as needed for Sleep.     Vitals:    03/19/17 1752   BP: 145/78   Pulse: 78   Temp: 37.5 ???C (99.5 ???F)   TempSrc: Oral   SpO2: 97%   Weight: 102.5 kg (226 lb)     Body mass index is 38.79 kg/m???.     Physical Exam   Constitutional: She is oriented to person, place, and time. She appears well-developed and well-nourished.   HENT:   Right Ear: External ear normal.   Left Ear: External ear normal.   Her nose is congested and runny.  Posterior pharynx slightly erythematous without exudate or cobblestoning   Cardiovascular: Normal rate and regular rhythm.   Pulmonary/Chest: Effort normal and breath sounds normal.   Lymphadenopathy:  She has no cervical adenopathy.   Neurological: She is alert and oriented to person, place, and time.   Psychiatric: She has a normal mood and affect. Her behavior is normal. Judgment and thought content normal.   Nursing note and vitals reviewed.           Assessment and Plan:  1. Acute nasopharyngitis         Plan  Continue decongestants nasal rinse analgesics  Z-pack.      Problem   Acute Nasopharyngitis

## 2017-04-28 ENCOUNTER — Encounter: Admit: 2017-04-28 | Discharge: 2017-04-28 | Payer: BC Managed Care – HMO

## 2017-04-28 MED ORDER — TIZANIDINE 4 MG PO TAB
ORAL_TABLET | Freq: Every evening | 2 refills | Status: AC
Start: 2017-04-28 — End: 2017-09-22

## 2017-05-24 ENCOUNTER — Encounter: Admit: 2017-05-24 | Discharge: 2017-05-24 | Payer: BC Managed Care – HMO

## 2017-05-25 MED ORDER — ATORVASTATIN 10 MG PO TAB
ORAL_TABLET | Freq: Every day | 3 refills | Status: AC
Start: 2017-05-25 — End: 2018-05-21

## 2017-06-20 ENCOUNTER — Ambulatory Visit: Admit: 2017-06-20 | Discharge: 2017-06-20 | Payer: BC Managed Care – HMO

## 2017-06-20 DIAGNOSIS — Z0000 Encounter for general adult medical examination without abnormal findings: ICD-10-CM

## 2017-06-20 DIAGNOSIS — E559 Vitamin D deficiency, unspecified: Principal | ICD-10-CM

## 2017-06-20 LAB — LIPID PROFILE
Lab: 129 mg/dL (ref <150)
Lab: 136 mg/dL (ref <200)
Lab: 66 mg/dL — ABNORMAL HIGH (ref <100)
Lab: 91 mg/dL (ref 6.0–8.0)

## 2017-06-20 LAB — CBC AND DIFF
Lab: 0 10*3/uL (ref 0–0.20)
Lab: 3.7 K/UL — ABNORMAL LOW (ref 4.5–11.0)
Lab: 4.1 M/UL — ABNORMAL LOW (ref 4.0–5.0)

## 2017-06-20 LAB — HIV 1& 2 AG-AB SCRN W REFLEX HIV 1 PCR QUANT: Lab: NEGATIVE mg/dL — ABNORMAL LOW (ref 0.3–1.2)

## 2017-06-20 LAB — COMPREHENSIVE METABOLIC PANEL
Lab: 13 U/L — ABNORMAL LOW (ref 7–56)
Lab: 141 MMOL/L — ABNORMAL HIGH (ref 137–147)
Lab: 19 U/L (ref 7–40)
Lab: 28 MMOL/L (ref 21–30)
Lab: 3.9 g/dL — ABNORMAL HIGH (ref 3.5–5.0)
Lab: 4 10*3/uL (ref 3–12)
Lab: 4.1 MMOL/L (ref 3.5–5.1)
Lab: 50 mL/min — ABNORMAL LOW (ref >60)
Lab: 88 U/L (ref 25–110)
Lab: >60 mL/min (ref >60)

## 2017-06-20 LAB — HEMOGLOBIN A1C: Lab: 5.9 % — ABNORMAL LOW (ref >60)

## 2017-06-20 LAB — TSH WITH FREE T4 REFLEX: Lab: 2.9 uU/mL — ABNORMAL HIGH (ref >40)

## 2017-06-21 LAB — 25-OH VITAMIN D (D2 + D3): Lab: 55 ng/mL — ABNORMAL LOW (ref 30–80)

## 2017-07-02 ENCOUNTER — Encounter: Admit: 2017-07-02 | Discharge: 2017-07-02 | Payer: BC Managed Care – HMO

## 2017-07-02 ENCOUNTER — Ambulatory Visit: Admit: 2017-07-02 | Discharge: 2017-07-02 | Payer: BC Managed Care – HMO

## 2017-07-02 DIAGNOSIS — G4733 Obstructive sleep apnea (adult) (pediatric): ICD-10-CM

## 2017-07-02 DIAGNOSIS — H04123 Dry eye syndrome of bilateral lacrimal glands: ICD-10-CM

## 2017-07-02 DIAGNOSIS — E785 Hyperlipidemia, unspecified: ICD-10-CM

## 2017-07-02 DIAGNOSIS — D72819 Decreased white blood cell count, unspecified: Principal | ICD-10-CM

## 2017-07-02 DIAGNOSIS — R011 Cardiac murmur, unspecified: ICD-10-CM

## 2017-07-02 DIAGNOSIS — H47299 Other optic atrophy, unspecified eye: Principal | ICD-10-CM

## 2017-07-02 DIAGNOSIS — G35 Multiple sclerosis: ICD-10-CM

## 2017-07-02 DIAGNOSIS — M1990 Unspecified osteoarthritis, unspecified site: ICD-10-CM

## 2017-07-02 DIAGNOSIS — K219 Gastro-esophageal reflux disease without esophagitis: ICD-10-CM

## 2017-07-02 DIAGNOSIS — G43909 Migraine, unspecified, not intractable, without status migrainosus: ICD-10-CM

## 2017-07-02 DIAGNOSIS — Z0000 Encounter for general adult medical examination without abnormal findings: ICD-10-CM

## 2017-07-02 DIAGNOSIS — I82409 Acute embolism and thrombosis of unspecified deep veins of unspecified lower extremity: ICD-10-CM

## 2017-07-02 MED ORDER — TRAZODONE 50 MG PO TAB
ORAL_TABLET | Freq: Every evening | 3 refills | Status: AC
Start: 2017-07-02 — End: 2017-09-02

## 2017-07-08 ENCOUNTER — Ambulatory Visit: Admit: 2017-07-08 | Discharge: 2017-07-09 | Payer: BC Managed Care – HMO

## 2017-07-08 ENCOUNTER — Encounter: Admit: 2017-07-08 | Discharge: 2017-07-08 | Payer: BC Managed Care – HMO

## 2017-07-08 DIAGNOSIS — K219 Gastro-esophageal reflux disease without esophagitis: ICD-10-CM

## 2017-07-08 DIAGNOSIS — H47299 Other optic atrophy, unspecified eye: Principal | ICD-10-CM

## 2017-07-08 DIAGNOSIS — R011 Cardiac murmur, unspecified: ICD-10-CM

## 2017-07-08 DIAGNOSIS — G4733 Obstructive sleep apnea (adult) (pediatric): ICD-10-CM

## 2017-07-08 DIAGNOSIS — H04123 Dry eye syndrome of bilateral lacrimal glands: ICD-10-CM

## 2017-07-08 DIAGNOSIS — I82409 Acute embolism and thrombosis of unspecified deep veins of unspecified lower extremity: ICD-10-CM

## 2017-07-08 DIAGNOSIS — M1990 Unspecified osteoarthritis, unspecified site: ICD-10-CM

## 2017-07-08 DIAGNOSIS — G35 Multiple sclerosis: ICD-10-CM

## 2017-07-08 DIAGNOSIS — E785 Hyperlipidemia, unspecified: ICD-10-CM

## 2017-07-08 DIAGNOSIS — G43909 Migraine, unspecified, not intractable, without status migrainosus: ICD-10-CM

## 2017-07-08 MED ORDER — TOPIRAMATE 25 MG PO TAB
ORAL_TABLET | 5 refills | Status: AC
Start: 2017-07-08 — End: 2017-11-05

## 2017-07-09 ENCOUNTER — Encounter: Admit: 2017-07-09 | Discharge: 2017-07-09 | Payer: BC Managed Care – HMO

## 2017-07-09 DIAGNOSIS — H04123 Dry eye syndrome of bilateral lacrimal glands: ICD-10-CM

## 2017-07-09 DIAGNOSIS — G4733 Obstructive sleep apnea (adult) (pediatric): ICD-10-CM

## 2017-07-09 DIAGNOSIS — K219 Gastro-esophageal reflux disease without esophagitis: ICD-10-CM

## 2017-07-09 DIAGNOSIS — G43909 Migraine, unspecified, not intractable, without status migrainosus: ICD-10-CM

## 2017-07-09 DIAGNOSIS — G35 Multiple sclerosis: Principal | ICD-10-CM

## 2017-07-09 DIAGNOSIS — H47299 Other optic atrophy, unspecified eye: Principal | ICD-10-CM

## 2017-07-09 DIAGNOSIS — M1990 Unspecified osteoarthritis, unspecified site: ICD-10-CM

## 2017-07-09 DIAGNOSIS — R011 Cardiac murmur, unspecified: ICD-10-CM

## 2017-07-09 DIAGNOSIS — E785 Hyperlipidemia, unspecified: ICD-10-CM

## 2017-07-09 DIAGNOSIS — I82409 Acute embolism and thrombosis of unspecified deep veins of unspecified lower extremity: ICD-10-CM

## 2017-07-09 MED ORDER — DICLOFENAC SODIUM 1 % TP GEL
4 g | Freq: Four times a day (QID) | TOPICAL | 3 refills | 19.00000 days | Status: AC
Start: 2017-07-09 — End: 2017-08-27

## 2017-07-19 ENCOUNTER — Encounter: Admit: 2017-07-19 | Discharge: 2017-07-19 | Payer: BC Managed Care – HMO

## 2017-07-20 MED ORDER — PANTOPRAZOLE 40 MG PO TBEC
ORAL_TABLET | Freq: Every evening | ORAL | 1 refills | 90.00000 days | Status: AC
Start: 2017-07-20 — End: 2018-01-15

## 2017-07-24 ENCOUNTER — Ambulatory Visit: Admit: 2017-07-24 | Discharge: 2017-07-24 | Payer: BC Managed Care – HMO

## 2017-07-24 ENCOUNTER — Encounter: Admit: 2017-07-24 | Discharge: 2017-07-24 | Payer: BC Managed Care – HMO

## 2017-07-24 DIAGNOSIS — H47299 Other optic atrophy, unspecified eye: Principal | ICD-10-CM

## 2017-07-24 DIAGNOSIS — I82409 Acute embolism and thrombosis of unspecified deep veins of unspecified lower extremity: ICD-10-CM

## 2017-07-24 DIAGNOSIS — E785 Hyperlipidemia, unspecified: ICD-10-CM

## 2017-07-24 DIAGNOSIS — M1990 Unspecified osteoarthritis, unspecified site: ICD-10-CM

## 2017-07-24 DIAGNOSIS — G4733 Obstructive sleep apnea (adult) (pediatric): ICD-10-CM

## 2017-07-24 DIAGNOSIS — R011 Cardiac murmur, unspecified: ICD-10-CM

## 2017-07-24 DIAGNOSIS — H04123 Dry eye syndrome of bilateral lacrimal glands: ICD-10-CM

## 2017-07-24 DIAGNOSIS — G43909 Migraine, unspecified, not intractable, without status migrainosus: ICD-10-CM

## 2017-07-24 DIAGNOSIS — J0100 Acute maxillary sinusitis, unspecified: Principal | ICD-10-CM

## 2017-07-24 DIAGNOSIS — G35 Multiple sclerosis: ICD-10-CM

## 2017-07-24 DIAGNOSIS — K219 Gastro-esophageal reflux disease without esophagitis: ICD-10-CM

## 2017-07-24 MED ORDER — AZITHROMYCIN 250 MG PO TAB
ORAL_TABLET | Freq: Every day | 0 refills | Status: AC
Start: 2017-07-24 — End: 2017-09-02

## 2017-07-31 ENCOUNTER — Encounter: Admit: 2017-07-31 | Discharge: 2017-07-31 | Payer: BC Managed Care – HMO

## 2017-07-31 MED ORDER — BACLOFEN 10 MG PO TAB
ORAL_TABLET | Freq: Four times a day (QID) | 0 refills | Status: AC
Start: 2017-07-31 — End: 2017-11-23

## 2017-08-27 ENCOUNTER — Ambulatory Visit: Admit: 2017-08-27 | Discharge: 2017-08-27 | Payer: BC Managed Care – HMO

## 2017-08-27 ENCOUNTER — Encounter: Admit: 2017-08-27 | Discharge: 2017-08-27 | Payer: BC Managed Care – HMO

## 2017-08-27 DIAGNOSIS — R011 Cardiac murmur, unspecified: ICD-10-CM

## 2017-08-27 DIAGNOSIS — H04123 Dry eye syndrome of bilateral lacrimal glands: ICD-10-CM

## 2017-08-27 DIAGNOSIS — K219 Gastro-esophageal reflux disease without esophagitis: ICD-10-CM

## 2017-08-27 DIAGNOSIS — G4733 Obstructive sleep apnea (adult) (pediatric): ICD-10-CM

## 2017-08-27 DIAGNOSIS — G43909 Migraine, unspecified, not intractable, without status migrainosus: ICD-10-CM

## 2017-08-27 DIAGNOSIS — H47299 Other optic atrophy, unspecified eye: Principal | ICD-10-CM

## 2017-08-27 DIAGNOSIS — I82409 Acute embolism and thrombosis of unspecified deep veins of unspecified lower extremity: ICD-10-CM

## 2017-08-27 DIAGNOSIS — G35 Multiple sclerosis: ICD-10-CM

## 2017-08-27 DIAGNOSIS — M1990 Unspecified osteoarthritis, unspecified site: ICD-10-CM

## 2017-08-27 DIAGNOSIS — M25562 Pain in left knee: Principal | ICD-10-CM

## 2017-08-27 DIAGNOSIS — E785 Hyperlipidemia, unspecified: ICD-10-CM

## 2017-08-27 MED ORDER — DICLOFENAC SODIUM 1 % TP GEL
4 g | Freq: Four times a day (QID) | TOPICAL | 3 refills | 19.00000 days | Status: AC
Start: 2017-08-27 — End: 2017-11-05

## 2017-09-01 ENCOUNTER — Encounter: Admit: 2017-09-01 | Discharge: 2017-09-01 | Payer: BC Managed Care – HMO

## 2017-09-01 MED ORDER — RIVAROXABAN 20 MG PO TAB
20 mg | ORAL_TABLET | Freq: Every day | ORAL | 1 refills | 30.00000 days | Status: AC
Start: 2017-09-01 — End: 2018-03-02

## 2017-09-02 ENCOUNTER — Ambulatory Visit: Admit: 2017-09-02 | Discharge: 2017-09-03 | Payer: BC Managed Care – HMO

## 2017-09-02 ENCOUNTER — Encounter: Admit: 2017-09-02 | Discharge: 2017-09-02 | Payer: BC Managed Care – HMO

## 2017-09-02 DIAGNOSIS — E785 Hyperlipidemia, unspecified: ICD-10-CM

## 2017-09-02 DIAGNOSIS — R011 Cardiac murmur, unspecified: ICD-10-CM

## 2017-09-02 DIAGNOSIS — G43909 Migraine, unspecified, not intractable, without status migrainosus: ICD-10-CM

## 2017-09-02 DIAGNOSIS — G35 Multiple sclerosis: ICD-10-CM

## 2017-09-02 DIAGNOSIS — G4733 Obstructive sleep apnea (adult) (pediatric): ICD-10-CM

## 2017-09-02 DIAGNOSIS — H04123 Dry eye syndrome of bilateral lacrimal glands: ICD-10-CM

## 2017-09-02 DIAGNOSIS — H47299 Other optic atrophy, unspecified eye: Principal | ICD-10-CM

## 2017-09-02 DIAGNOSIS — M1990 Unspecified osteoarthritis, unspecified site: ICD-10-CM

## 2017-09-02 DIAGNOSIS — I82409 Acute embolism and thrombosis of unspecified deep veins of unspecified lower extremity: ICD-10-CM

## 2017-09-02 DIAGNOSIS — K219 Gastro-esophageal reflux disease without esophagitis: ICD-10-CM

## 2017-09-02 MED ORDER — ROPIVACAINE (PF) 5 MG/ML (0.5 %) IJ SOLN
4 mL | Freq: Once | INTRAMUSCULAR | 0 refills | Status: CP | PRN
Start: 2017-09-02 — End: ?
  Administered 2017-09-02: 15:00:00 4 mL via INTRAMUSCULAR

## 2017-09-02 MED ORDER — TRIAMCINOLONE ACETONIDE 40 MG/ML IJ SUSP
80 mg | Freq: Once | INTRAMUSCULAR | 0 refills | Status: CP | PRN
Start: 2017-09-02 — End: ?
  Administered 2017-09-02: 15:00:00 80 mg via INTRAMUSCULAR

## 2017-09-03 DIAGNOSIS — M1712 Unilateral primary osteoarthritis, left knee: ICD-10-CM

## 2017-09-03 DIAGNOSIS — M25562 Pain in left knee: Principal | ICD-10-CM

## 2017-09-03 DIAGNOSIS — G8929 Other chronic pain: ICD-10-CM

## 2017-09-21 ENCOUNTER — Encounter: Admit: 2017-09-21 | Discharge: 2017-09-21 | Payer: BC Managed Care – HMO

## 2017-09-22 MED ORDER — TIZANIDINE 4 MG PO TAB
ORAL_TABLET | Freq: Every evening | 2 refills | Status: AC
Start: 2017-09-22 — End: 2018-03-01

## 2017-10-02 ENCOUNTER — Encounter: Admit: 2017-10-02 | Discharge: 2017-10-02 | Payer: BC Managed Care – HMO

## 2017-10-02 ENCOUNTER — Ambulatory Visit: Admit: 2017-10-02 | Discharge: 2017-10-03 | Payer: BC Managed Care – HMO

## 2017-10-02 DIAGNOSIS — M1990 Unspecified osteoarthritis, unspecified site: ICD-10-CM

## 2017-10-02 DIAGNOSIS — H47299 Other optic atrophy, unspecified eye: Principal | ICD-10-CM

## 2017-10-02 DIAGNOSIS — R011 Cardiac murmur, unspecified: ICD-10-CM

## 2017-10-02 DIAGNOSIS — I82409 Acute embolism and thrombosis of unspecified deep veins of unspecified lower extremity: ICD-10-CM

## 2017-10-02 DIAGNOSIS — G43909 Migraine, unspecified, not intractable, without status migrainosus: ICD-10-CM

## 2017-10-02 DIAGNOSIS — K219 Gastro-esophageal reflux disease without esophagitis: ICD-10-CM

## 2017-10-02 DIAGNOSIS — H04123 Dry eye syndrome of bilateral lacrimal glands: ICD-10-CM

## 2017-10-02 DIAGNOSIS — G35 Multiple sclerosis: ICD-10-CM

## 2017-10-02 DIAGNOSIS — E785 Hyperlipidemia, unspecified: ICD-10-CM

## 2017-10-02 DIAGNOSIS — G4733 Obstructive sleep apnea (adult) (pediatric): ICD-10-CM

## 2017-10-03 DIAGNOSIS — G8929 Other chronic pain: ICD-10-CM

## 2017-10-03 DIAGNOSIS — M25562 Pain in left knee: Principal | ICD-10-CM

## 2017-10-05 ENCOUNTER — Encounter: Admit: 2017-10-05 | Discharge: 2017-10-05 | Payer: BC Managed Care – HMO

## 2017-10-05 DIAGNOSIS — M25562 Pain in left knee: Principal | ICD-10-CM

## 2017-11-05 ENCOUNTER — Ambulatory Visit: Admit: 2017-11-05 | Discharge: 2017-11-06 | Payer: BC Managed Care – HMO

## 2017-11-05 ENCOUNTER — Encounter: Admit: 2017-11-05 | Discharge: 2017-11-05 | Payer: BC Managed Care – HMO

## 2017-11-05 DIAGNOSIS — E785 Hyperlipidemia, unspecified: ICD-10-CM

## 2017-11-05 DIAGNOSIS — G35 Multiple sclerosis: Principal | ICD-10-CM

## 2017-11-05 DIAGNOSIS — H04123 Dry eye syndrome of bilateral lacrimal glands: ICD-10-CM

## 2017-11-05 DIAGNOSIS — M1990 Unspecified osteoarthritis, unspecified site: ICD-10-CM

## 2017-11-05 DIAGNOSIS — G4733 Obstructive sleep apnea (adult) (pediatric): ICD-10-CM

## 2017-11-05 DIAGNOSIS — H47299 Other optic atrophy, unspecified eye: Principal | ICD-10-CM

## 2017-11-05 DIAGNOSIS — I82409 Acute embolism and thrombosis of unspecified deep veins of unspecified lower extremity: ICD-10-CM

## 2017-11-05 DIAGNOSIS — K219 Gastro-esophageal reflux disease without esophagitis: ICD-10-CM

## 2017-11-05 DIAGNOSIS — G43909 Migraine, unspecified, not intractable, without status migrainosus: ICD-10-CM

## 2017-11-05 DIAGNOSIS — R011 Cardiac murmur, unspecified: ICD-10-CM

## 2017-11-05 MED ORDER — OXYBUTYNIN CHLORIDE 5 MG PO TAB
5 mg | ORAL_TABLET | Freq: Two times a day (BID) | ORAL | 5 refills | 12.00000 days | Status: AC
Start: 2017-11-05 — End: 2018-05-14

## 2017-11-20 ENCOUNTER — Encounter: Admit: 2017-11-20 | Discharge: 2017-11-20 | Payer: BC Managed Care – HMO

## 2017-11-23 MED ORDER — BACLOFEN 10 MG PO TAB
ORAL_TABLET | Freq: Four times a day (QID) | 5 refills | Status: AC
Start: 2017-11-23 — End: 2018-05-14

## 2018-01-06 ENCOUNTER — Ambulatory Visit: Admit: 2018-01-06 | Discharge: 2018-01-07 | Payer: BC Managed Care – HMO

## 2018-01-06 ENCOUNTER — Encounter: Admit: 2018-01-06 | Discharge: 2018-01-06 | Payer: BC Managed Care – HMO

## 2018-01-06 DIAGNOSIS — G4733 Obstructive sleep apnea (adult) (pediatric): ICD-10-CM

## 2018-01-06 DIAGNOSIS — E785 Hyperlipidemia, unspecified: ICD-10-CM

## 2018-01-06 DIAGNOSIS — M1990 Unspecified osteoarthritis, unspecified site: ICD-10-CM

## 2018-01-06 DIAGNOSIS — G35 Multiple sclerosis: ICD-10-CM

## 2018-01-06 DIAGNOSIS — R011 Cardiac murmur, unspecified: ICD-10-CM

## 2018-01-06 DIAGNOSIS — H04123 Dry eye syndrome of bilateral lacrimal glands: ICD-10-CM

## 2018-01-06 DIAGNOSIS — G43909 Migraine, unspecified, not intractable, without status migrainosus: ICD-10-CM

## 2018-01-06 DIAGNOSIS — R05 Cough: ICD-10-CM

## 2018-01-06 DIAGNOSIS — I82409 Acute embolism and thrombosis of unspecified deep veins of unspecified lower extremity: ICD-10-CM

## 2018-01-06 DIAGNOSIS — K219 Gastro-esophageal reflux disease without esophagitis: ICD-10-CM

## 2018-01-06 DIAGNOSIS — H47299 Other optic atrophy, unspecified eye: Principal | ICD-10-CM

## 2018-01-06 DIAGNOSIS — J22 Unspecified acute lower respiratory infection: Principal | ICD-10-CM

## 2018-01-06 MED ORDER — AZITHROMYCIN 250 MG PO TAB
ORAL_TABLET | Freq: Every day | 0 refills | Status: AC
Start: 2018-01-06 — End: 2018-06-16

## 2018-01-06 MED ORDER — ALBUTEROL SULFATE 0.63 MG/3 ML IN NEBU
0.63 mg | RESPIRATORY_TRACT | 0 refills | Status: AC | PRN
Start: 2018-01-06 — End: 2019-05-06

## 2018-01-14 ENCOUNTER — Encounter: Admit: 2018-01-14 | Discharge: 2018-01-14 | Payer: BC Managed Care – HMO

## 2018-01-15 MED ORDER — PANTOPRAZOLE 40 MG PO TBEC
ORAL_TABLET | Freq: Every evening | ORAL | 1 refills | 90.00000 days | Status: AC
Start: 2018-01-15 — End: 2018-06-20

## 2018-01-22 ENCOUNTER — Encounter: Admit: 2018-01-22 | Discharge: 2018-01-22 | Payer: BC Managed Care – HMO

## 2018-02-26 ENCOUNTER — Encounter: Admit: 2018-02-26 | Discharge: 2018-02-26 | Payer: BC Managed Care – HMO

## 2018-03-01 ENCOUNTER — Encounter: Admit: 2018-03-01 | Discharge: 2018-03-01 | Payer: BC Managed Care – HMO

## 2018-03-01 MED ORDER — TIZANIDINE 4 MG PO TAB
8 mg | ORAL_TABLET | Freq: Every evening | ORAL | 5 refills | Status: AC
Start: 2018-03-01 — End: 2018-05-14

## 2018-03-02 ENCOUNTER — Encounter: Admit: 2018-03-02 | Discharge: 2018-03-02 | Payer: BC Managed Care – HMO

## 2018-03-02 MED ORDER — XARELTO 20 MG PO TAB
ORAL_TABLET | Freq: Every day | ORAL | 1 refills | 30.00000 days | Status: AC
Start: 2018-03-02 — End: 2018-07-26

## 2018-05-14 ENCOUNTER — Ambulatory Visit: Admit: 2018-05-14 | Discharge: 2018-05-15 | Payer: BC Managed Care – HMO

## 2018-05-14 ENCOUNTER — Encounter: Admit: 2018-05-14 | Discharge: 2018-05-14 | Payer: BC Managed Care – HMO

## 2018-05-14 DIAGNOSIS — M1990 Unspecified osteoarthritis, unspecified site: ICD-10-CM

## 2018-05-14 DIAGNOSIS — G35 Multiple sclerosis: Principal | ICD-10-CM

## 2018-05-14 DIAGNOSIS — H47299 Other optic atrophy, unspecified eye: Principal | ICD-10-CM

## 2018-05-14 DIAGNOSIS — G43909 Migraine, unspecified, not intractable, without status migrainosus: ICD-10-CM

## 2018-05-14 DIAGNOSIS — E785 Hyperlipidemia, unspecified: ICD-10-CM

## 2018-05-14 DIAGNOSIS — R011 Cardiac murmur, unspecified: ICD-10-CM

## 2018-05-14 DIAGNOSIS — I82409 Acute embolism and thrombosis of unspecified deep veins of unspecified lower extremity: ICD-10-CM

## 2018-05-14 DIAGNOSIS — K219 Gastro-esophageal reflux disease without esophagitis: ICD-10-CM

## 2018-05-14 DIAGNOSIS — G4733 Obstructive sleep apnea (adult) (pediatric): ICD-10-CM

## 2018-05-14 DIAGNOSIS — H04123 Dry eye syndrome of bilateral lacrimal glands: ICD-10-CM

## 2018-05-14 MED ORDER — OXYBUTYNIN CHLORIDE 5 MG PO TAB
5 mg | ORAL_TABLET | Freq: Three times a day (TID) | ORAL | 5 refills | 12.00000 days | Status: AC
Start: 2018-05-14 — End: 2019-02-07

## 2018-05-14 MED ORDER — BACLOFEN 10 MG PO TAB
10 mg | ORAL_TABLET | Freq: Four times a day (QID) | ORAL | 1 refills | Status: AC
Start: 2018-05-14 — End: 2019-05-06

## 2018-05-14 MED ORDER — TIZANIDINE 4 MG PO TAB
8 mg | ORAL_TABLET | Freq: Every evening | ORAL | 1 refills | Status: AC
Start: 2018-05-14 — End: 2019-03-17

## 2018-05-14 NOTE — Progress Notes
Date of Service: 05/14/2018    Subjective:             Sandra Ortega is a 63 y.o. female.    History of Present Illness  She found oxybutynin initially very helpful, but this wore off. She is back to wearing a pad. She is noticing her mouth and lips are little dry, but its tolerable. She is having more left knee pain and is seeing an orthopedic surgeon. Headaches are not bothersome to her.  She has not noticed any changes in her physical function.        Review of Systems   All other systems reviewed and are negative.        Objective:         ??? albuterol (ACCUNEB) 0.63 mg/3 mL nebulizer solution Inhale 3 mL solution by nebulizer as directed every 6 hours as needed for Wheezing.   ??? aspirin/acetaminophen/caffeine(+) (EXCEDRIN MIGRAINE) 250/250/65 mg tab Take 2 Tabs by mouth daily as needed (migraines).   ??? atorvastatin (LIPITOR) 10 mg tablet TAKE 1 TABLET BY MOUTH EVERY DAY AT BEDTIME   ??? azithromycin (ZITHROMAX) 250 mg tablet Take 2 tabs by mouth on day 1, followed by 1 tab by mouth daily on days 2 - 5.   ??? baclofen (LIORESAL) 10 mg tablet Take one tablet by mouth four times daily.   ??? calcium carbonate/vitamin D-3 (OSCAL-500+D) 1250 mg/200 unit tablet Take 1 tablet by mouth twice daily. Calcium Carb 1250mg  delivers 500mg  elemental Ca   ??? Carboxymethylcellulose Sodium (REFRESH LIQUIGEL) 1 % dlgl Apply 1 drop to both eyes twice daily.   ??? Cholecalciferol (Vitamin D3) 2,000 unit cap Take 2,000 Units by mouth at bedtime daily.   ??? fish oil- omega 3-DHA/EPA 300/1,000 mg capsule Take 1 capsule by mouth at bedtime daily.   ??? ibuprofen (MOTRIN) 600 mg tablet Take 1 tablet by mouth every 6 hours as needed for Pain. Take with food.   ??? lactulose 10 gram/15 mL oral solution Take 15 mL by mouth daily as needed.   ??? oxybutynin chloride (DITROPAN) 5 mg tablet Take one tablet by mouth three times daily.   ??? pantoprazole DR (PROTONIX) 40 mg tablet TAKE 1 TABLET BY MOUTH EVERY NIGHT AT BEDTIME ??? tiZANidine (ZANAFLEX) 4 mg tablet Take two tablets by mouth at bedtime daily.   ??? XARELTO 20 mg tablet TAKE ONE TABLET BY MOUTH DAILY. TAKE WITH FOOD.     Vitals:    05/14/18 1614   BP: 144/82   BP Source: Arm, Left Upper   Patient Position: Sitting   Pulse: 78   Weight: 99.5 kg (219 lb 6.4 oz)   Height: 162.6 cm (64)   PainSc: Four     Body mass index is 37.66 kg/m???.     Physical Exam      Examination    Mental Status Exam: Patient is alert and oriented in all four spheres. There is normal short and long term memory. Language functions are normal both for comprehension and expression.  Speech and Language: nl  HEENT: Normal.  Cranial Nerve Exam:   Cranial Nerve II Right Left   Visual Acuity 20/30 20/40   Pupil     Visual Fields     Fundoscopic     Color Vision       Cranial Nerves III-XII Right Left   III, IV, VI (EOM's) x x   V x x   VII x x   VIII x x  IX, X x x   XI x x   XII x x     Nystagmus: None    Motor:    R L   R L   Neck flexors    Hip flexors 4 5-   Neck extensors    Hip abductors     Shoulder Abductors 5 5  Hip extensors 5 5   Elbow flexors 5 5  Hip adductors     Elbow extensors 5 5  Knee flexors 5 5   Wrist flexors 5 5  Knee extensors 5 5   Wrist extensors 5 5  Ankle dorsiflexors 5 5   Finger flexors 5 5  Ankle plantar flexors 5 5   Finger abductors 5 5  Ankle inversion      5 5  Ankle eversion         Toe flexors         Toe extensors       Bulk and Tone:    Upper Extremity R L   Atrophy No No   Increased Tone No No   Fasciculations No No     Lower Extremity R L   Atrophy No No   Increased Tone No No   Fasciculations No No     Cerebellar/Fine Motor R L   Finger nose finger Normal Normal   Heel to shin     Finger tapping     Foot tapping     Rapid alternating movements Normal Normal     Gait: antalgic to left  Ambulation Index: 5.62    9 hole peg    RH:18.10  LH:19.94    Depression Screening was performed on Sandra Ortega in clinic today. Based

## 2018-05-21 ENCOUNTER — Encounter: Admit: 2018-05-21 | Discharge: 2018-05-21 | Payer: BC Managed Care – HMO

## 2018-05-21 MED ORDER — ATORVASTATIN 10 MG PO TAB
ORAL_TABLET | Freq: Every day | 3 refills | Status: AC
Start: 2018-05-21 — End: 2019-05-06

## 2018-05-21 NOTE — Telephone Encounter
[facility].  Last refill was 05/25/2017 ( #90 x3).  Last visit was 10/02/2017, next visit is unknown.  This medication is under standing order protocol.   Jonathon Bellows, LPN

## 2018-06-03 ENCOUNTER — Encounter: Admit: 2018-06-03 | Discharge: 2018-06-03 | Payer: BC Managed Care – HMO

## 2018-06-08 MED ORDER — TOLTERODINE 2 MG PO TAB
2 mg | ORAL_TABLET | Freq: Two times a day (BID) | ORAL | 5 refills | 90.00000 days | Status: AC
Start: 2018-06-08 — End: 2019-01-24

## 2018-06-15 ENCOUNTER — Encounter: Admit: 2018-06-15 | Discharge: 2018-06-15 | Payer: BC Managed Care – HMO

## 2018-06-15 DIAGNOSIS — M25562 Pain in left knee: Principal | ICD-10-CM

## 2018-06-16 ENCOUNTER — Ambulatory Visit: Admit: 2018-06-16 | Discharge: 2018-06-16 | Payer: BC Managed Care – HMO

## 2018-06-16 ENCOUNTER — Encounter: Admit: 2018-06-16 | Discharge: 2018-06-16 | Payer: BC Managed Care – HMO

## 2018-06-16 DIAGNOSIS — H47299 Other optic atrophy, unspecified eye: Principal | ICD-10-CM

## 2018-06-16 DIAGNOSIS — M1712 Unilateral primary osteoarthritis, left knee: ICD-10-CM

## 2018-06-16 DIAGNOSIS — M25562 Pain in left knee: Principal | ICD-10-CM

## 2018-06-16 DIAGNOSIS — G35 Multiple sclerosis: ICD-10-CM

## 2018-06-16 DIAGNOSIS — G4733 Obstructive sleep apnea (adult) (pediatric): ICD-10-CM

## 2018-06-16 DIAGNOSIS — I82409 Acute embolism and thrombosis of unspecified deep veins of unspecified lower extremity: ICD-10-CM

## 2018-06-16 DIAGNOSIS — R011 Cardiac murmur, unspecified: ICD-10-CM

## 2018-06-16 DIAGNOSIS — H04123 Dry eye syndrome of bilateral lacrimal glands: ICD-10-CM

## 2018-06-16 DIAGNOSIS — K219 Gastro-esophageal reflux disease without esophagitis: ICD-10-CM

## 2018-06-16 DIAGNOSIS — G43909 Migraine, unspecified, not intractable, without status migrainosus: ICD-10-CM

## 2018-06-16 DIAGNOSIS — M1990 Unspecified osteoarthritis, unspecified site: ICD-10-CM

## 2018-06-16 DIAGNOSIS — E785 Hyperlipidemia, unspecified: ICD-10-CM

## 2018-06-16 NOTE — Patient Instructions
[facility]  [address]   [place], [place] [address]    [phone]

## 2018-06-16 NOTE — Progress Notes
Genitourinary: Negative.    Musculoskeletal: Positive for arthralgias, gait problem and joint swelling.   Skin: Negative.    Allergic/Immunologic: Negative.    Hematological: Negative.    Psychiatric/Behavioral: Negative.    All other systems reviewed and are negative.        Objective:         ??? albuterol (ACCUNEB) 0.63 mg/3 mL nebulizer solution Inhale 3 mL solution by nebulizer as directed every 6 hours as needed for Wheezing.   ??? aspirin/acetaminophen/caffeine(+) (EXCEDRIN MIGRAINE) 250/250/65 mg tab Take 2 Tabs by mouth daily as needed (migraines).   ??? atorvastatin (LIPITOR) 10 mg tablet TAKE 1 TABLET BY MOUTH EVERY DAY AT BEDTIME   ??? baclofen (LIORESAL) 10 mg tablet Take one tablet by mouth four times daily.   ??? calcium carbonate/vitamin D-3 (OSCAL-500+D) 1250 mg/200 unit tablet Take 1 tablet by mouth twice daily. Calcium Carb 1250mg  delivers 500mg  elemental Ca   ??? Carboxymethylcellulose Sodium (REFRESH LIQUIGEL) 1 % dlgl Apply 1 drop to both eyes twice daily.   ??? Cholecalciferol (Vitamin D3) 2,000 unit cap Take 2,000 Units by mouth at bedtime daily.   ??? fish oil- omega 3-DHA/EPA 300/1,000 mg capsule Take 1 capsule by mouth at bedtime daily.   ??? ibuprofen (MOTRIN) 600 mg tablet Take 1 tablet by mouth every 6 hours as needed for Pain. Take with food.   ??? lactulose 10 gram/15 mL oral solution Take 15 mL by mouth daily as needed.   ??? oxybutynin chloride (DITROPAN) 5 mg tablet Take one tablet by mouth three times daily.   ??? pantoprazole DR (PROTONIX) 40 mg tablet TAKE 1 TABLET BY MOUTH EVERY NIGHT AT BEDTIME   ??? tiZANidine (ZANAFLEX) 4 mg tablet Take two tablets by mouth at bedtime daily.   ??? tolterodine (DETROL) 2 mg tablet Take one tablet by mouth twice daily.   ??? XARELTO 20 mg tablet TAKE ONE TABLET BY MOUTH DAILY. TAKE WITH FOOD.     Vitals:    06/16/18 0755   BP: 138/80   Pulse: 86   SpO2: 98%   Weight: 100.9 kg (222 lb 6.4 oz)   Height: 162.6 cm (64)   PainSc: Four     Body mass index is 38.17 kg/m???.

## 2018-06-19 ENCOUNTER — Encounter: Admit: 2018-06-19 | Discharge: 2018-06-19 | Payer: BC Managed Care – HMO

## 2018-06-20 MED ORDER — PANTOPRAZOLE 40 MG PO TBEC
ORAL_TABLET | Freq: Every evening | ORAL | 1 refills | 90.00000 days | Status: AC
Start: 2018-06-20 — End: 2018-12-10

## 2018-07-25 ENCOUNTER — Encounter: Admit: 2018-07-25 | Discharge: 2018-07-25 | Payer: BC Managed Care – HMO

## 2018-07-26 MED ORDER — XARELTO 20 MG PO TAB
ORAL_TABLET | Freq: Every day | ORAL | 1 refills | 30.00000 days | Status: DC
Start: 2018-07-26 — End: 2019-01-20

## 2018-09-23 ENCOUNTER — Encounter: Admit: 2018-09-23 | Discharge: 2018-09-23

## 2018-10-05 ENCOUNTER — Encounter: Admit: 2018-09-23 | Discharge: 2018-10-05

## 2018-10-05 DIAGNOSIS — M1712 Unilateral primary osteoarthritis, left knee: Principal | ICD-10-CM

## 2018-10-07 ENCOUNTER — Encounter: Admit: 2018-10-07 | Discharge: 2018-10-07

## 2018-10-07 ENCOUNTER — Encounter: Admit: 2018-10-07 | Discharge: 2018-11-05

## 2018-11-05 DIAGNOSIS — M1712 Unilateral primary osteoarthritis, left knee: Principal | ICD-10-CM

## 2018-12-10 ENCOUNTER — Encounter: Admit: 2018-12-10 | Discharge: 2018-12-10

## 2018-12-10 MED ORDER — PANTOPRAZOLE 40 MG PO TBEC
ORAL_TABLET | Freq: Every evening | ORAL | 0 refills | 90.00000 days | Status: DC
Start: 2018-12-10 — End: 2019-03-17

## 2018-12-10 NOTE — Telephone Encounter
Refill request received from [facility] for pantoprazole.  Last refill was 06/20/2018 ( #90 x1).  Last visit was 10/02/2017, next visit is unknown.  This medication is under standing order protocol.     Refilled three month supply with request to schedule a follow up appointment.   Nani Skillern, LPN

## 2019-01-06 ENCOUNTER — Encounter: Admit: 2019-01-06 | Discharge: 2019-01-06 | Payer: BC Managed Care – HMO

## 2019-01-19 ENCOUNTER — Encounter: Admit: 2019-01-19 | Discharge: 2019-01-19 | Payer: BC Managed Care – HMO

## 2019-01-19 NOTE — Telephone Encounter
Med[facility] for Xarelto.  Last refill was 07/26/2018 (#90 X1).   Last office visit was 10/02/2017, next office visit is 03/17/2019.   Medication is not under standing order protocol.   Routing to Dr. Marlane Hatcher for approval.    Nani Skillern, LPN

## 2019-01-20 MED ORDER — XARELTO 20 MG PO TAB
ORAL_TABLET | Freq: Every day | ORAL | 1 refills | 30.00000 days | Status: DC
Start: 2019-01-20 — End: 2019-05-06

## 2019-01-24 ENCOUNTER — Encounter: Admit: 2019-01-24 | Discharge: 2019-01-24 | Payer: BC Managed Care – HMO

## 2019-01-24 MED ORDER — TOLTERODINE 2 MG PO TAB
2 mg | ORAL_TABLET | Freq: Two times a day (BID) | ORAL | 5 refills | 90.00000 days | Status: DC
Start: 2019-01-24 — End: 2019-05-06

## 2019-01-24 NOTE — Telephone Encounter
Refill request for tolterodine, last office visit 05-14-18 refill sent

## 2019-01-27 ENCOUNTER — Encounter: Admit: 2019-01-27 | Discharge: 2019-01-27 | Payer: BC Managed Care – HMO

## 2019-01-27 DIAGNOSIS — Z0000 Encounter for general adult medical examination without abnormal findings: Secondary | ICD-10-CM

## 2019-01-27 MED ORDER — MISCELLANEOUS MEDICAL SUPPLY MISC MISC
0 refills | 1.00000 days | Status: AC | PRN
Start: 2019-01-27 — End: ?

## 2019-01-27 NOTE — Telephone Encounter
Pt called stating she needs lab orders to complete blood work, an order to complete her bone density screening and a prescription to be sent to [facility] for a new CPAP machine. Orders pending approval. Routing to PCP.   Nani Skillern, LPN

## 2019-01-27 NOTE — Telephone Encounter
Orders signed.

## 2019-01-28 ENCOUNTER — Encounter: Admit: 2019-01-28 | Discharge: 2019-01-28 | Payer: BC Managed Care – HMO

## 2019-02-07 ENCOUNTER — Encounter: Admit: 2019-02-07 | Discharge: 2019-02-07 | Payer: BC Managed Care – HMO

## 2019-02-07 MED ORDER — OXYBUTYNIN CHLORIDE 5 MG PO TAB
5 mg | ORAL_TABLET | Freq: Three times a day (TID) | ORAL | 5 refills | 12.00000 days | Status: DC
Start: 2019-02-07 — End: 2019-05-06

## 2019-02-07 NOTE — Telephone Encounter
Refill request for oxybutynin, last office visit 05-14-18 rx included in plan of care. Refills sent

## 2019-02-17 ENCOUNTER — Encounter: Admit: 2019-02-17 | Discharge: 2019-02-17 | Payer: BC Managed Care – HMO

## 2019-02-17 ENCOUNTER — Ambulatory Visit: Admit: 2019-02-17 | Discharge: 2019-02-18 | Payer: BC Managed Care – HMO

## 2019-02-17 DIAGNOSIS — G4733 Obstructive sleep apnea (adult) (pediatric): Secondary | ICD-10-CM

## 2019-02-17 DIAGNOSIS — I82409 Acute embolism and thrombosis of unspecified deep veins of unspecified lower extremity: Secondary | ICD-10-CM

## 2019-02-17 DIAGNOSIS — R011 Cardiac murmur, unspecified: Secondary | ICD-10-CM

## 2019-02-17 DIAGNOSIS — E785 Hyperlipidemia, unspecified: Secondary | ICD-10-CM

## 2019-02-17 DIAGNOSIS — G35 Multiple sclerosis: Secondary | ICD-10-CM

## 2019-02-17 DIAGNOSIS — K219 Gastro-esophageal reflux disease without esophagitis: Secondary | ICD-10-CM

## 2019-02-17 DIAGNOSIS — G43909 Migraine, unspecified, not intractable, without status migrainosus: Secondary | ICD-10-CM

## 2019-02-17 DIAGNOSIS — M1990 Unspecified osteoarthritis, unspecified site: Secondary | ICD-10-CM

## 2019-02-17 DIAGNOSIS — H47299 Other optic atrophy, unspecified eye: Secondary | ICD-10-CM

## 2019-02-17 DIAGNOSIS — H04123 Dry eye syndrome of bilateral lacrimal glands: Secondary | ICD-10-CM

## 2019-02-17 MED ORDER — PREDNISONE 10 MG PO TAB
ORAL_TABLET | Freq: Every day | ORAL | 0 refills | Status: AC
Start: 2019-02-17 — End: ?

## 2019-02-17 NOTE — Patient Instructions
1. Take Prednisone Taper as prescribed  2. May take Oral Benadryl as needed for itching    Managing a Poison Ivy, [address], or [employer] Reaction  If you come in contact with urushiol    If you think you may have come in contact with the sap oil (urushiol) contained in poison ivy, poison oak, or poison sumac plants, it's important to wash the affected part of your skin as soon as possible to remove the sap oil or help prevent further spread. Wash the affected areas with soap and cool water. Undress, and wash your clothes and gear as soon as you can. Be sure to wash any pet that was with you. Taking these steps can help prevent spreading sap oil to someone else. If you have a rash, but aren't sure if it's from one of these plants,?see your healthcare provider.   To soothe the itching  Your skin may react to poison oak, poison ivy, and poison sumac within hours to a few days after contact. Listed below are some common home care treatments. If these aren't effective in relieving symptoms call your healthcare provider for additional treatments which may include prescription medicines.   ? Don?t scratch or scrub your rash. Not even if the itching is severe. Scratching can lead to infection.  ? Bathe in lukewarm (not hot) water. Or take short cool showers to relieve the itching.?For a more soothing bath, add oatmeal to the water.  ? Use antihistamines that are taken by mouth.  These include diphenhydramine. You can buy these at the grocery store or pharmacy. Talk to your healthcare provider or pharmacist for more information on oral antihistamines.  ? Use over-the-counter treatments on your skin.  These include cortisone creams and calamine lotion.  How your skin may react A mild rash may become red, swollen, and itchy. The rash may form a line on your skin where you brushed against the plant. If you have a severe rash, your itching may worsen or your rash may blister and ooze. If this happens, seek medical care. The fluid from your blisters will not make your rash spread. With or without medical care, your rash may last up to 3 weeks. In the future, try to avoid coming in contact with these plants.   When to call your healthcare provider  Call your healthcare provider or seek immediate medical attention if:   ? Your rash is severe  ? The rash spreads beyond the exposed part of your body or affects your face.  ? The rash does not clear up within a few weeks  Call 911  Call 911 if you have any of the following:   ? Trouble breathing or swallowing  ? Any significant swelling  [employer] last reviewed this educational content on 11/05/2017  ? 2000-2020 [employer], [place]. [address], [place], [place] [address]. All rights reserved. This information is not intended as a substitute for professional medical care. Always follow your healthcare professional's instructions.

## 2019-02-17 NOTE — Progress Notes
Name: Sandra Ortega          MRN: [id number]      DOB: [birth year]      AGE: 63 y.o.   DATE OF SERVICE: 02/17/2019    Subjective:             Reason for Visit:  Rash (Red rash on Right forearm and forhead, painful x 2 days .  Pt states it is poison ivy )      Sandra Ortega is a 63 y.o. female.        History of Present Illness  [name] presents to urgent care with complaints of an itchy rash. She reports that she was clearing brush off of her property on Saturday and two days ago, she developed itching and an erythematous rash on her right forearm. The area of redness increased in size and she then developed a similar itchy rash on her forehead and chin and a small area on her left forearm also developed. She has been applying topical alcohol and Benadryl to the rash without much improvement. She has had poison ivy in the past and states that these are the same symptoms she previously had.        Review of Systems   Constitutional: Negative.    HENT: Negative.    Eyes: Negative.    Respiratory: Negative.    Cardiovascular: Negative.    Gastrointestinal: Negative.    Endocrine: Negative.    Genitourinary: Negative.    Musculoskeletal: Negative.    Skin: Positive for color change and rash.   Allergic/Immunologic: Negative.    Neurological: Negative.    Hematological: Negative.    Psychiatric/Behavioral: Negative.          Objective:         ? albuterol (ACCUNEB) 0.63 mg/3 mL nebulizer solution Inhale 3 mL solution by nebulizer as directed every 6 hours as needed for Wheezing.   ? aspirin/acetaminophen/caffeine(+) (EXCEDRIN MIGRAINE) 250/250/65 mg tab Take 2 Tabs by mouth daily as needed (migraines).   ? atorvastatin (LIPITOR) 10 mg tablet TAKE 1 TABLET BY MOUTH EVERY DAY AT BEDTIME   ? baclofen (LIORESAL) 10 mg tablet Take one tablet by mouth four times daily.   ? calcium carbonate/vitamin D-3 (OSCAL-500+D) 1250 mg/200 unit tablet Take 1 tablet by mouth twice daily. Calcium Carb 1250mg  delivers 500mg  elemental Ca ? Carboxymethylcellulose Sodium (REFRESH LIQUIGEL) 1 % dlgl Apply 1 drop to both eyes twice daily.   ? Cholecalciferol (Vitamin D3) 2,000 unit cap Take 2,000 Units by mouth at bedtime daily.   ? fish oil- omega 3-DHA/EPA 300/1,000 mg capsule Take 1 capsule by mouth at bedtime daily.   ? ibuprofen (MOTRIN) 600 mg tablet Take 1 tablet by mouth every 6 hours as needed for Pain. Take with food.   ? lactulose 10 gram/15 mL oral solution Take 15 mL by mouth daily as needed.   ? Miscellaneous Medical Supply misc CPAP machine. Any and all parts needed. Fix or repair as needed.   ? oxybutynin chloride (DITROPAN) 5 mg tablet Take one tablet by mouth three times daily.   ? pantoprazole DR (PROTONIX) 40 mg tablet TAKE 1 TABLET BY MOUTH EVERY NIGHT AT BEDTIME   ? predniSONE (DELTASONE) 10 mg tablet Take four tablets by mouth daily with breakfast for 3 days, THEN three tablets daily with breakfast for 3 days, THEN two tablets daily with breakfast for 3 days, THEN one tablet daily with breakfast for 3 days.   ? tiZANidine (  ZANAFLEX) 4 mg tablet Take two tablets by mouth at bedtime daily.   ? tolterodine (DETROL) 2 mg tablet Take one tablet by mouth twice daily.   ? XARELTO 20 mg tablet TAKE 1 TABLET BY MOUTH EVERY DAY WITH FOOD     Vitals:    02/17/19 1738   BP: 139/80   Pulse: 82   Resp: 18   Temp: 37.2 ?C (99 ?F)   TempSrc: Oral   SpO2: 98%   Weight: 104.8 kg (231 lb)     Body mass index is 39.65 kg/m?Marland Kitchen                     Physical Exam  Vitals signs reviewed.   Constitutional:       General: She is not in acute distress.     Appearance: Normal appearance. She is not ill-appearing.   HENT:      Head: Normocephalic.   Pulmonary:      Effort: Pulmonary effort is normal.   Skin:     General: Skin is warm and dry.      Findings: Erythema and rash present.          Neurological:      General: No focal deficit present.      Mental Status: She is alert and oriented to person, place, and time.   Psychiatric: Mood and Affect: Mood normal.         Behavior: Behavior normal.         Thought Content: Thought content normal.         Judgment: Judgment normal.               Assessment and Plan:  Odis Hollingshead was seen today for rash.    Diagnoses and all orders for this visit:    Poison ivy dermatitis  -     predniSONE (DELTASONE) 10 mg tablet; Take four tablets by mouth daily with breakfast for 3 days, THEN three tablets daily with breakfast for 3 days, THEN two tablets daily with breakfast for 3 days, THEN one tablet daily with breakfast for 3 days.      Patient Instructions   1. Take Prednisone Taper as prescribed  2. May take Oral Benadryl as needed for itching    Managing a Poison Ivy, [address], or [employer]  If you come in contact with urushiol    If you think you may have come in contact with the sap oil (urushiol) contained in poison ivy, poison oak, or poison sumac plants, it's important to wash the affected part of your skin as soon as possible to remove the sap oil or help prevent further spread. Wash the affected areas with soap and cool water. Undress, and wash your clothes and gear as soon as you can. Be sure to wash any pet that was with you. Taking these steps can help prevent spreading sap oil to someone else. If you have a rash, but aren't sure if it's from one of these plants,?see your healthcare provider.   To soothe the itching  Your skin may react to poison oak, poison ivy, and poison sumac within hours to a few days after contact. Listed below are some common home care treatments. If these aren't effective in relieving symptoms call your healthcare provider for additional treatments which may include prescription medicines.   ? Don?t scratch or scrub your rash. Not even if the itching is severe. Scratching can lead to infection.  ?  Bathe in lukewarm (not hot) water. Or take short cool showers to relieve the itching.?For a more soothing bath, add oatmeal to the water. ? Use antihistamines that are taken by mouth.  These include diphenhydramine. You can buy these at the grocery store or pharmacy. Talk to your healthcare provider or pharmacist for more information on oral antihistamines.  ? Use over-the-counter treatments on your skin.  These include cortisone creams and calamine lotion.  How your skin may react  A mild rash may become red, swollen, and itchy. The rash may form a line on your skin where you brushed against the plant. If you have a severe rash, your itching may worsen or your rash may blister and ooze. If this happens, seek medical care. The fluid from your blisters will not make your rash spread. With or without medical care, your rash may last up to 3 weeks. In the future, try to avoid coming in contact with these plants.   When to call your healthcare provider  Call your healthcare provider or seek immediate medical attention if:   ? Your rash is severe  ? The rash spreads beyond the exposed part of your body or affects your face.  ? The rash does not clear up within a few weeks  Call 911  Call 911 if you have any of the following:   ? Trouble breathing or swallowing  ? Any significant swelling  [employer] last reviewed this educational content on 11/05/2017  ? 2000-2020 [employer], [place]. [address] [place], [place] [address]. All rights reserved. This information is not intended as a substitute for professional medical care. Always follow your healthcare professional's instructions.        I have personally reviewed the documentation for the evaluation and management of this patient performed by the APRN who provided the care.  I agree with her findings diagnosis and management unless indicated below otherwise.I examined the pt and reviewed her history with her.

## 2019-02-18 DIAGNOSIS — L237 Allergic contact dermatitis due to plants, except food: Secondary | ICD-10-CM

## 2019-02-28 ENCOUNTER — Encounter: Admit: 2019-02-28 | Discharge: 2019-02-28 | Payer: BC Managed Care – HMO

## 2019-03-11 ENCOUNTER — Encounter: Admit: 2019-03-11 | Discharge: 2019-03-11 | Payer: BC Managed Care – HMO

## 2019-03-11 MED ORDER — PANTOPRAZOLE 40 MG PO TBEC
ORAL_TABLET | Freq: Every evening | 0 refills
Start: 2019-03-11 — End: ?

## 2019-03-11 NOTE — Telephone Encounter
Refill request received from [facility] for pantoprazole.  Last refill was 12/10/2018 ( #[phone] x0).    Pt has an upcoming appointment on 03/17/2019. Will refill during office visit.   Nani Skillern, LPN

## 2019-03-16 ENCOUNTER — Encounter: Admit: 2019-03-16 | Discharge: 2019-03-16 | Payer: BC Managed Care – HMO

## 2019-03-16 ENCOUNTER — Ambulatory Visit: Admit: 2019-03-16 | Discharge: 2019-03-16 | Payer: BC Managed Care – HMO

## 2019-03-16 DIAGNOSIS — Z0000 Encounter for general adult medical examination without abnormal findings: Secondary | ICD-10-CM

## 2019-03-16 LAB — COMPREHENSIVE METABOLIC PANEL
Lab: 0.5 mg/dL (ref 0.3–1.2)
Lab: 108 MMOL/L (ref 98–110)
Lab: 141 MMOL/L (ref 137–147)
Lab: 15 U/L (ref 7–56)
Lab: 19 U/L (ref 7–40)
Lab: 20 mg/dL (ref 7–25)
Lab: 28 MMOL/L (ref 21–30)
Lab: 4.1 g/dL (ref 3.5–5.0)
Lab: 4.3 MMOL/L (ref 3.5–5.1)
Lab: 56 mL/min — ABNORMAL LOW (ref >60)
Lab: 6.7 g/dL (ref 6.0–8.0)
Lab: 79 U/L (ref 25–110)
Lab: 9.6 mg/dL (ref 8.5–10.6)
Lab: >60 mL/min (ref >60)
Lab: 5 (ref 3–12)

## 2019-03-16 LAB — LIPID PROFILE
Lab: 142 mg/dL (ref <150)
Lab: 175 mg/dL (ref <200)
Lab: 51 mg/dL (ref >40)
Lab: 98 mg/dL (ref <100)

## 2019-03-16 LAB — CBC AND DIFF
Lab: 1 % (ref 0–2)
Lab: 1.7 K/UL (ref 1.0–4.8)
Lab: 31 pg (ref 26–34)
Lab: 33 g/dL (ref 32.0–36.0)
Lab: 4.2 M/UL (ref 4.0–5.0)
Lab: 4.4 10*3/uL — ABNORMAL LOW (ref 4.5–11.0)
Lab: 40 % (ref 36–45)

## 2019-03-16 LAB — TSH WITH FREE T4 REFLEX: Lab: 3.3 uU/mL (ref 0.35–5.00)

## 2019-03-16 LAB — HEMOGLOBIN A1C: Lab: 6 % (ref 4.0–6.0)

## 2019-03-17 ENCOUNTER — Encounter: Admit: 2019-03-17 | Discharge: 2019-03-17 | Payer: BC Managed Care – HMO

## 2019-03-17 ENCOUNTER — Ambulatory Visit: Admit: 2019-03-17 | Discharge: 2019-03-17 | Payer: BC Managed Care – HMO

## 2019-03-17 DIAGNOSIS — G43909 Migraine, unspecified, not intractable, without status migrainosus: Secondary | ICD-10-CM

## 2019-03-17 DIAGNOSIS — H04123 Dry eye syndrome of bilateral lacrimal glands: Secondary | ICD-10-CM

## 2019-03-17 DIAGNOSIS — R011 Cardiac murmur, unspecified: Secondary | ICD-10-CM

## 2019-03-17 DIAGNOSIS — E785 Hyperlipidemia, unspecified: Secondary | ICD-10-CM

## 2019-03-17 DIAGNOSIS — M1990 Unspecified osteoarthritis, unspecified site: Secondary | ICD-10-CM

## 2019-03-17 DIAGNOSIS — I82409 Acute embolism and thrombosis of unspecified deep veins of unspecified lower extremity: Secondary | ICD-10-CM

## 2019-03-17 DIAGNOSIS — H47299 Other optic atrophy, unspecified eye: Secondary | ICD-10-CM

## 2019-03-17 DIAGNOSIS — K219 Gastro-esophageal reflux disease without esophagitis: Secondary | ICD-10-CM

## 2019-03-17 DIAGNOSIS — G35 Multiple sclerosis: Secondary | ICD-10-CM

## 2019-03-17 DIAGNOSIS — G4733 Obstructive sleep apnea (adult) (pediatric): Secondary | ICD-10-CM

## 2019-03-17 DIAGNOSIS — Z0000 Encounter for general adult medical examination without abnormal findings: Secondary | ICD-10-CM

## 2019-03-17 MED ORDER — PANTOPRAZOLE 40 MG PO TBEC
40 mg | ORAL_TABLET | Freq: Every evening | ORAL | 3 refills | 90.00000 days | Status: DC
Start: 2019-03-17 — End: 2019-05-06

## 2019-03-17 MED ORDER — LOSARTAN 25 MG PO TAB
25 mg | ORAL_TABLET | Freq: Every day | ORAL | 3 refills | 30.00000 days | Status: DC
Start: 2019-03-17 — End: 2019-05-06

## 2019-03-17 NOTE — Patient Instructions
Recommendations for a good night of sleep:    Sleep only as much as you need to feel rested and then get out of bed.  Keep a regular sleep schedule.  Avoid forcing sleep.  Exercise regularly for at least 20 minutes, preferably 4 to 5 hours before bedtime.  Avoid caffeinated beverages after lunch.  Avoid alcohol near bedtime: no "night cap"   Avoid smoking, especially in the evening.  Do not go to bed hungry.  Adjust bedroom environment.  Avoid prolonged use of light-emitting screens before bedtime.   Deal with your worries before bedtime.

## 2019-03-31 ENCOUNTER — Encounter: Admit: 2019-03-31 | Discharge: 2019-03-31 | Payer: BC Managed Care – HMO

## 2019-03-31 DIAGNOSIS — Z0000 Encounter for general adult medical examination without abnormal findings: Secondary | ICD-10-CM

## 2019-04-11 ENCOUNTER — Encounter: Admit: 2019-04-11 | Discharge: 2019-04-11 | Payer: BC Managed Care – HMO

## 2019-04-11 ENCOUNTER — Ambulatory Visit: Admit: 2019-04-11 | Discharge: 2019-04-12 | Payer: BC Managed Care – HMO

## 2019-04-11 DIAGNOSIS — G35 Multiple sclerosis: Secondary | ICD-10-CM

## 2019-04-11 DIAGNOSIS — N879 Dysplasia of cervix uteri, unspecified: Secondary | ICD-10-CM

## 2019-04-11 DIAGNOSIS — H47299 Other optic atrophy, unspecified eye: Secondary | ICD-10-CM

## 2019-04-11 DIAGNOSIS — G43909 Migraine, unspecified, not intractable, without status migrainosus: Secondary | ICD-10-CM

## 2019-04-11 DIAGNOSIS — R011 Cardiac murmur, unspecified: Secondary | ICD-10-CM

## 2019-04-11 DIAGNOSIS — K219 Gastro-esophageal reflux disease without esophagitis: Secondary | ICD-10-CM

## 2019-04-11 DIAGNOSIS — H04123 Dry eye syndrome of bilateral lacrimal glands: Secondary | ICD-10-CM

## 2019-04-11 DIAGNOSIS — G4733 Obstructive sleep apnea (adult) (pediatric): Secondary | ICD-10-CM

## 2019-04-11 DIAGNOSIS — I82409 Acute embolism and thrombosis of unspecified deep veins of unspecified lower extremity: Secondary | ICD-10-CM

## 2019-04-11 DIAGNOSIS — M1990 Unspecified osteoarthritis, unspecified site: Secondary | ICD-10-CM

## 2019-04-11 DIAGNOSIS — E785 Hyperlipidemia, unspecified: Secondary | ICD-10-CM

## 2019-04-11 NOTE — Patient Instructions
General Instructions    For prompt and efficient communication, MyChart is preferred. Please sign up.    To Contact Me:   Please send a MyChart message to your doctor or call [phone] to reach your doctor's nurse team. Please only call once in a 24-hour period. Leave a voicemail for the nurse to respond.  Calls or messages received after 4pm will be returned the next business day.  To Have Medication Refilled:   Please use the MyChart Refill request or contact your pharmacy directly to request medication refills. Please allow 72 hours.  To Receive Your Test Results:  ? Please allow 2 business day for normal and abnormal labs to result in MyChart.  ? Please allow 4 days for other tests, including cardiac studies, blood bank, and radiology results that are not deemed as sensitive.  ? It can take up to 10 days for pathology, microbiology, and sensitive tests.  Results NOT released to MyChart:  ? Prenatal ultrasound scanned results in the OB high risk clinic  ? Genetic testing  ? Labs that are sent to outside facilities  Our Lab (Location, Hours, and Fasting Information)  Lab Location: the 1st floor of the Medical Office Building, directly to the left of the Information Desk.  Lab Hours: Monday 6:30 am-6 pm, Tuesday-Friday 7 am-6 pm, and Saturday 7 am-noon.   Fasting for Lab: For fasting labs, please:   Do not eat for at least 8-10 hours before having your labs drawn, drink plenty of water, and make sure to continue your medications as prescribed (unless otherwise specified).  Radiology is on the 2nd floor of the Medical Office Building.  To Schedule an Appointment:   Contact our schedulers at [phone] and select #1 to make an appointment. At this time, you will be encouraged to signed up to MyChart if you not active.     To Receive Appointment Reminders on Your Cell Phone:   Make sure we have your cell phone number, and text [place] to [phone].  For Urgent Questions: For medical emergencies, call 911.  On nights, weekends or holidays, call the Hospital operator at [phone], and ask for the Ob/Gyn Physician on call or Certified Nurse Midwife on call.           We value your feedback and you may receive a survey about your experience with our office. If you had a favorable experience, the only positive survey results that we will receive are if we are rated in the 9 or 10 range out of 10 points.

## 2019-04-11 NOTE — Procedures
ENDOMETRIAL BIOPSY    DATE OF PROCEDURE:  04/11/19    INDICATIONS FOR PROCEDURE: History of atypical glandular cells on a hysteroscopic polypectomy in 2018    PROCEDURE AND FINDINGS:  Informed and written consent was obtained and all questions answered. Risks reviewed include: bleeding, uterine perforation and infection.    Patient is post-menopausal.    The cervix was prepped with betadine.  Single tooth tenaculum was used.  The pipelle was easily inserted and yielded visible tissue on 2 passes. The uterus sounded to 6 cm. The patient tolerated the procedure well.      Complications:  None    Dr. Midge Minium was present for the entire procedure.    Georgina Snell, MD, PhD  Obstetrics and Gynecology, PGY4  Pager - [phone]

## 2019-05-06 ENCOUNTER — Encounter: Admit: 2019-05-06 | Discharge: 2019-05-06 | Payer: BC Managed Care – HMO

## 2019-05-06 MED ORDER — PANTOPRAZOLE 40 MG PO TBEC
40 mg | ORAL_TABLET | Freq: Every evening | ORAL | 3 refills | 90.00000 days | Status: AC
Start: 2019-05-06 — End: ?

## 2019-05-06 MED ORDER — ALBUTEROL SULFATE 0.63 MG/3 ML IN NEBU
0.63 mg | RESPIRATORY_TRACT | 3 refills | Status: AC | PRN
Start: 2019-05-06 — End: ?

## 2019-05-06 MED ORDER — TOLTERODINE 2 MG PO TAB
2 mg | ORAL_TABLET | Freq: Every day | ORAL | 3 refills | 90.00000 days | Status: AC
Start: 2019-05-06 — End: ?

## 2019-05-06 MED ORDER — OXYBUTYNIN CHLORIDE 5 MG PO TAB
5 mg | ORAL_TABLET | Freq: Every evening | ORAL | 3 refills | 12.00000 days | Status: AC
Start: 2019-05-06 — End: ?

## 2019-05-06 MED ORDER — BACLOFEN 10 MG PO TAB
10 mg | ORAL_TABLET | Freq: Every day | ORAL | 3 refills | Status: AC
Start: 2019-05-06 — End: ?

## 2019-05-06 MED ORDER — LOSARTAN 25 MG PO TAB
25 mg | ORAL_TABLET | Freq: Every day | ORAL | 3 refills | 30.00000 days | Status: AC
Start: 2019-05-06 — End: ?

## 2019-05-06 MED ORDER — RIVAROXABAN 20 MG PO TAB
20 mg | ORAL_TABLET | Freq: Every day | ORAL | 3 refills | 30.00000 days | Status: AC
Start: 2019-05-06 — End: ?

## 2019-05-06 MED ORDER — ATORVASTATIN 10 MG PO TAB
10 mg | ORAL_TABLET | Freq: Every evening | ORAL | 3 refills | Status: AC
Start: 2019-05-06 — End: ?

## 2019-05-06 NOTE — Telephone Encounter
Pt called stating all maintenance medications need to be sent to [facility] mail order pharmacy. Recently moved to [place] but will be back in July for PCP visit. Reviewed medication list and medications needed are pending approval.   Jonathon Bellows, LPN

## 2019-06-09 ENCOUNTER — Encounter: Admit: 2019-06-09 | Discharge: 2019-06-09 | Payer: BC Managed Care – HMO

## 2019-07-26 NOTE — Telephone Encounter
We usually do not hold anticoagulants for a deep cleaning. OK to continue.

## 2019-07-26 NOTE — Telephone Encounter
Informed pt that she can continue anticoagulants for deep cleaning. Verbalized understanding. No further questions or concerns at this time.   Jonathon Bellows, LPN

## 2019-07-26 NOTE — Telephone Encounter
Pt called stating she is in [place] and has not yet found a new PCP. Pt is needing to complete a deep teeth cleaning and wants to know if she should hold her blood thinners? Please advise.   Jonathon Bellows, LPN

## 2019-09-14 ENCOUNTER — Encounter: Admit: 2019-09-14 | Discharge: 2019-09-14 | Payer: BC Managed Care – HMO

## 2019-12-19 ENCOUNTER — Encounter: Admit: 2019-12-19 | Discharge: 2019-12-19 | Payer: BC Managed Care – HMO

## 2020-07-23 ENCOUNTER — Encounter: Admit: 2020-07-23 | Discharge: 2020-07-23 | Payer: BC Managed Care – HMO

## 2020-07-31 ENCOUNTER — Encounter: Admit: 2020-07-31 | Discharge: 2020-07-31 | Payer: BC Managed Care – HMO

## 2020-12-27 ENCOUNTER — Encounter: Admit: 2020-12-27 | Discharge: 2020-12-27 | Payer: BC Managed Care – HMO

## 2021-02-15 ENCOUNTER — Encounter: Admit: 2021-02-15 | Discharge: 2021-02-15 | Payer: BC Managed Care – HMO

## 2021-02-18 ENCOUNTER — Encounter: Admit: 2021-02-18 | Discharge: 2021-02-18 | Payer: BC Managed Care – HMO

## 2022-01-01 ENCOUNTER — Encounter: Admit: 2022-01-01 | Discharge: 2022-01-01 | Payer: BC Managed Care – HMO

## 2022-08-19 ENCOUNTER — Encounter: Admit: 2022-08-19 | Discharge: 2022-08-19 | Payer: BC Managed Care – HMO

## 2022-12-10 ENCOUNTER — Encounter: Admit: 2022-12-10 | Discharge: 2022-12-10 | Payer: BC Managed Care – HMO

## 2022-12-10 NOTE — Telephone Encounter
-----   Message from Emilee Hero, MD sent at 12/10/2022 11:19 AM CDT -----  Regarding: RE: MS physician recommendation  I didn't find anything close. [facility] in [place] has an MS Center...  ----- Message -----  From: Steffanie Dunn, LPN  Sent: [phone]/65/7846   1:04 PM CDT  To: Lynnell Jude, MD  Subject: MS physician recommendation                      Patient has moved to [facility], [place] and is asking if you know if any MS physicians there or around that you can recommend for her?

## 2022-12-10 NOTE — Telephone Encounter
Left detailed message for patient with Dr. Emilee Hero recommendations

## 2023-06-22 IMAGING — MR MRI LUMBAR SPINE W/WO CONTRAST
6 of 11 series · 10 of 48 positions shown · IV contrast (gadavist)
Comparison: None.

________________________________________________________________________________________________ 
MRI LUMBAR SPINE W/WO CONTRAST, 06/22/2023 [DATE]: 
CLINICAL INDICATION: New onset right-sided tingling
TECHNIQUE: Multiplanar, multiecho position MR images of the lumbar spine were 
performed without and with 10.5 mL of Gadavist were injected intravenously by 
hand. 4.5 mL discarded.

[Series 901: survey · axial · 10.0mm · 1.25mm/px · 1 of 10 slices shown]
[im 1/10]
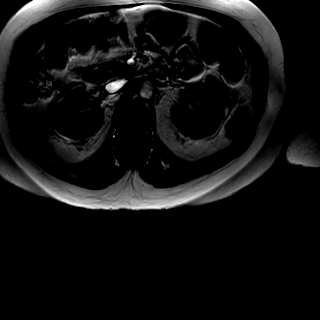

[Series 1001: t2w_cor-surv · coronal · 6.0mm · 0.62mm/px · 1 of 11 slices shown]
[im 1/11]
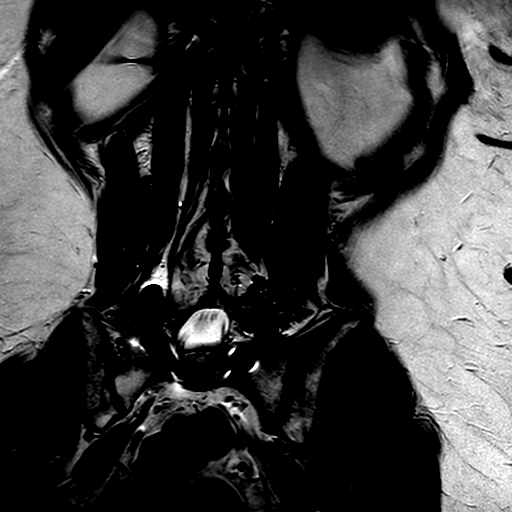

[Series 1101: t1_tse_sag · sagittal · 4.0mm · 0.48mm/px · 2 of 18 slices shown]
[im 1/18]
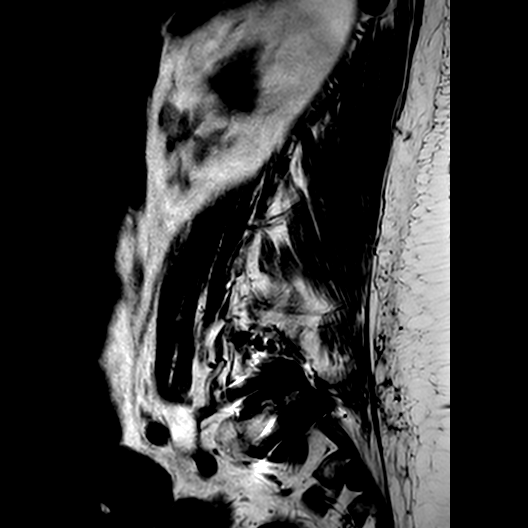
[im 18/18]
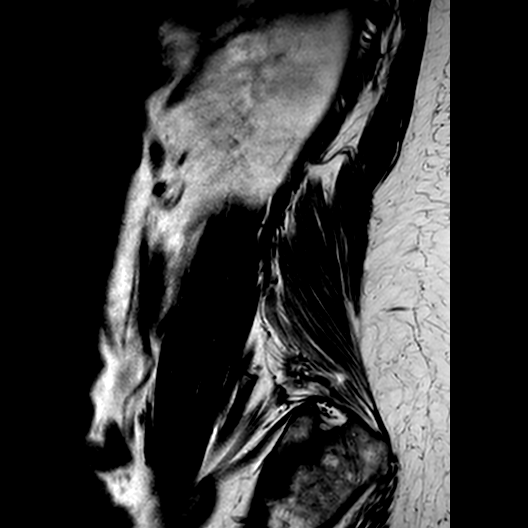

[Series 1202: (id)_mdixon_tse · sagittal · 4.0mm · 0.32mm/px · 2 of 18 slices shown]
[im 1/18]
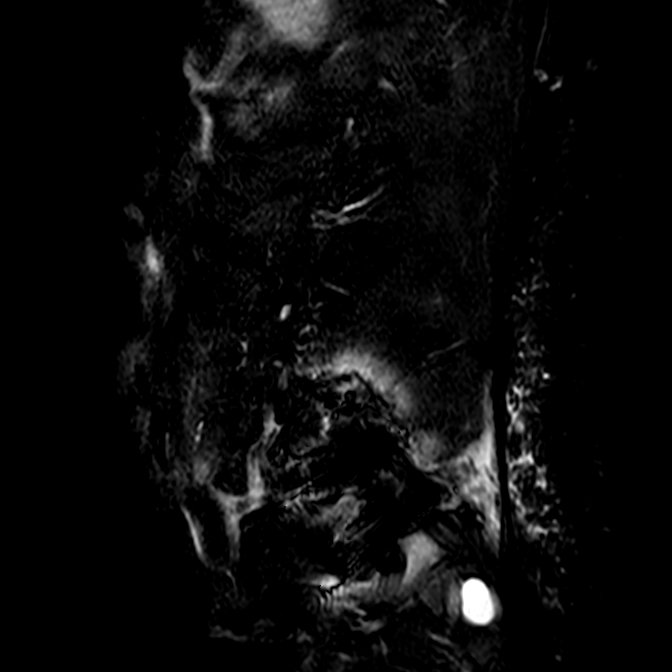
[im 18/18]
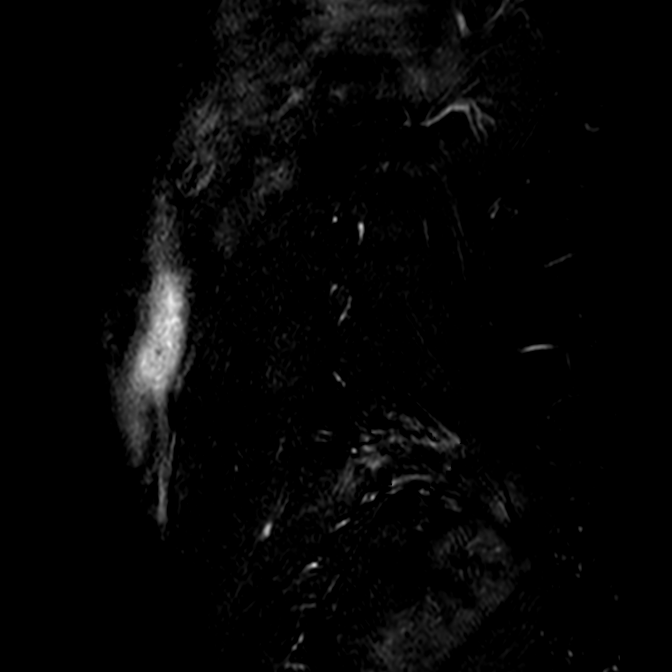

[Series 1203: st2w_mdixon_tse · sagittal · 4.0mm · 0.32mm/px · 2 of 18 slices shown]
[im 1/18]
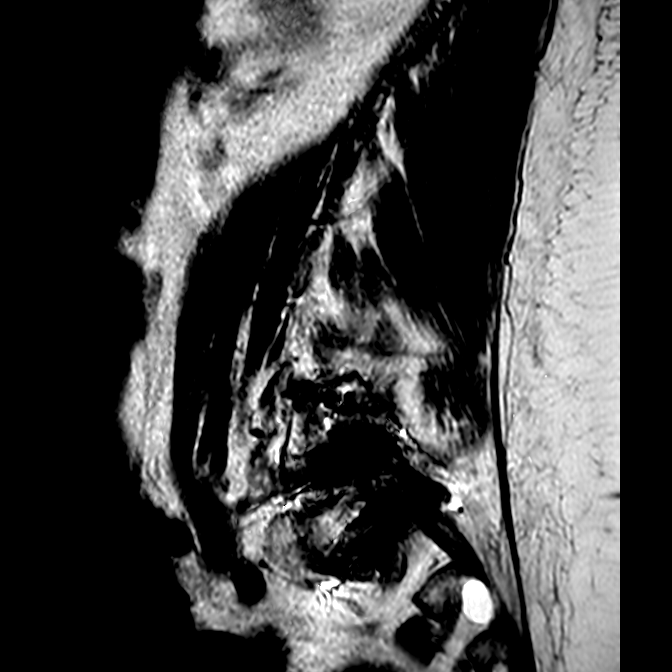
[im 18/18]
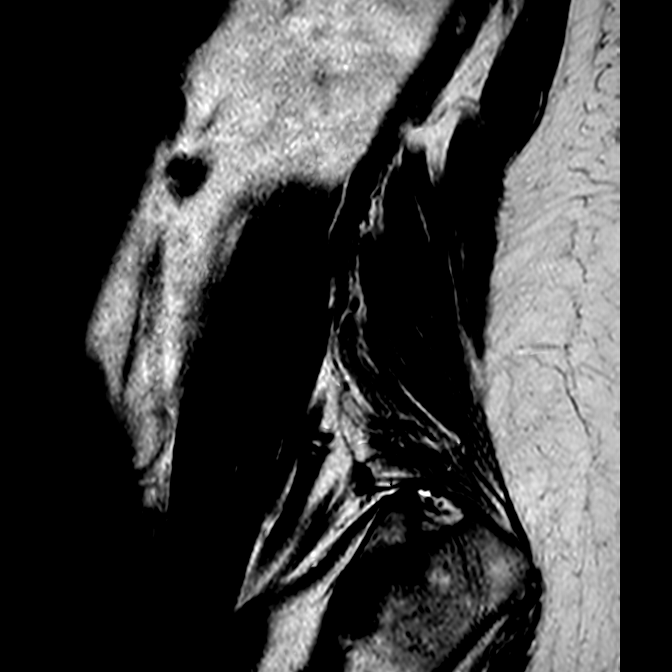

[Series 1302: (id) view_ax mpr · axial · 1.0mm · 0.25mm/px · z∈[-565,-526]mm · 2 of 124 slices shown]
[im 1/124]
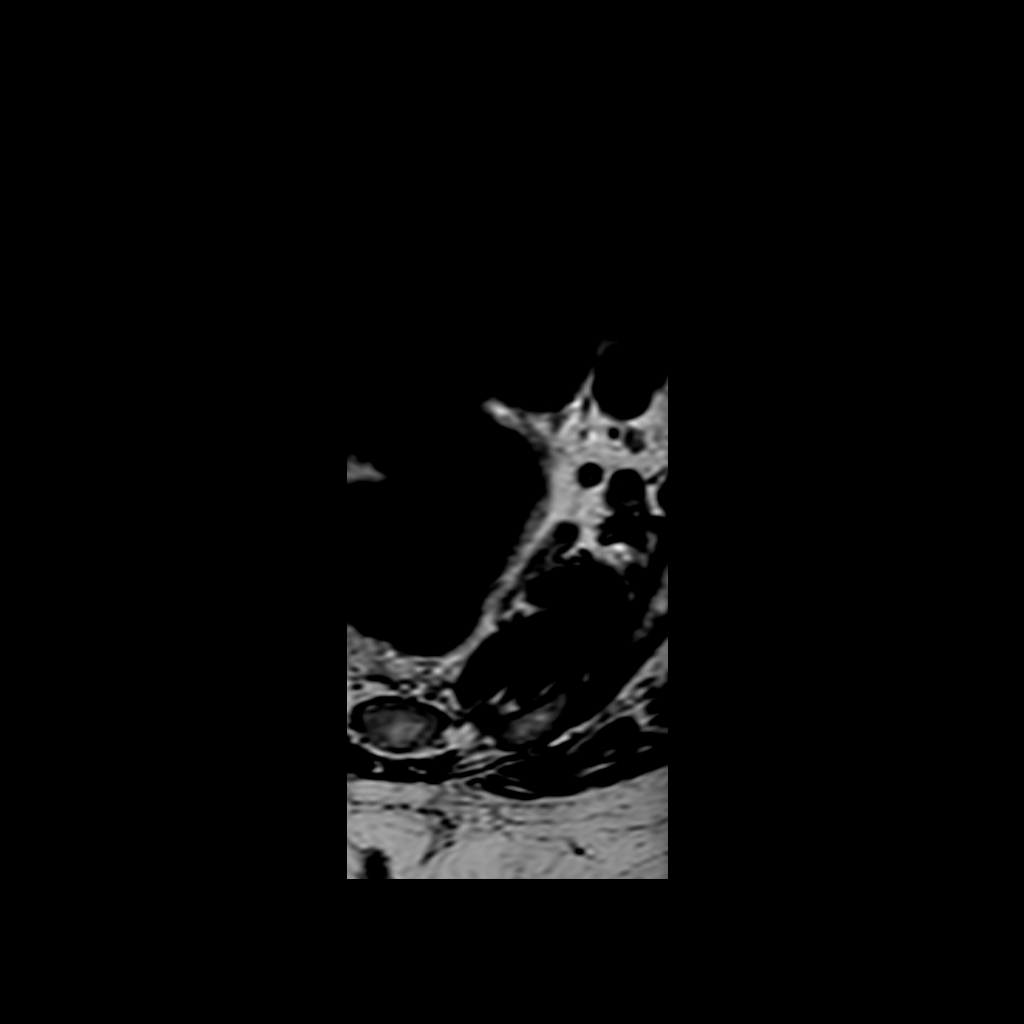
[im 18/124]
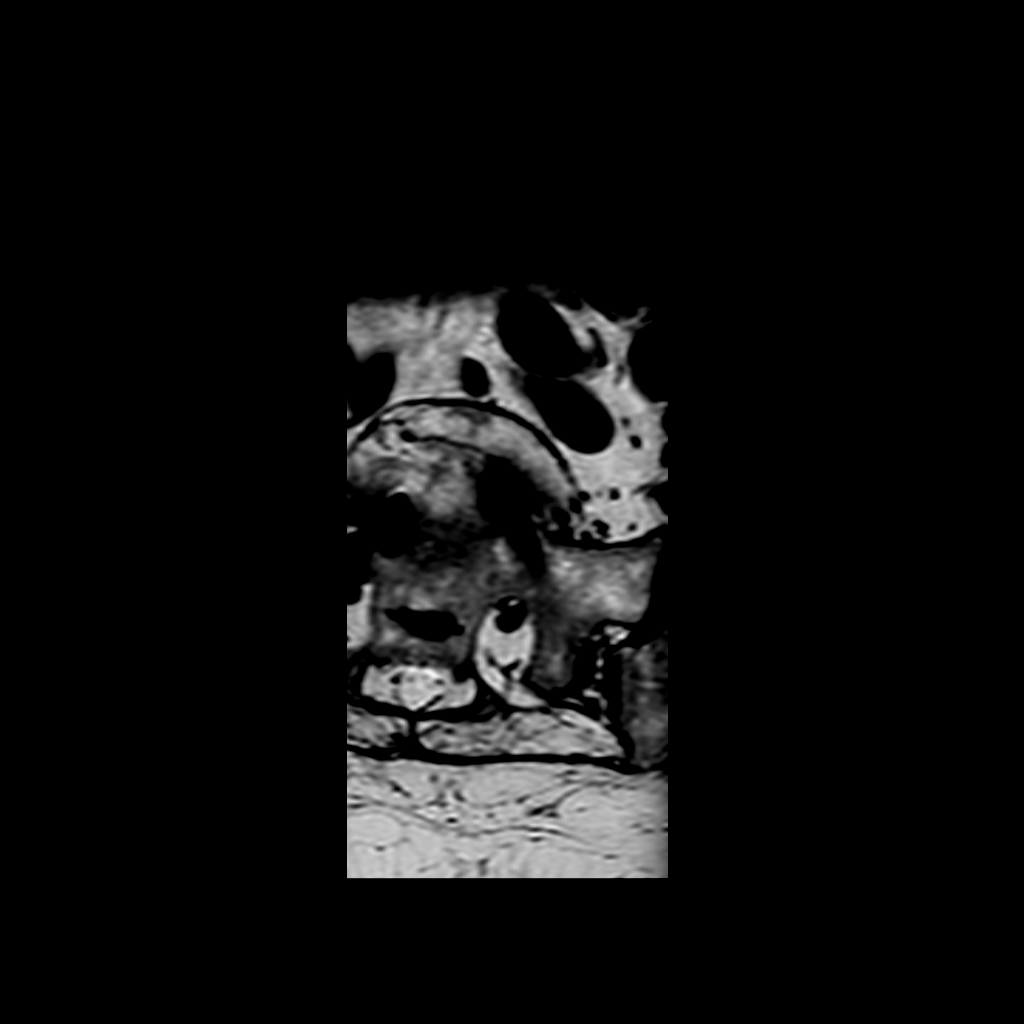

[10 of 48 positions shown; findings below may reference images not displayed]

FINDINGS: -------------------------------------------------------------------------------- 
------ 
GENERAL: 
SURGICAL: Status post lumbar hardware fusion at L5-S1 with pedicle screws and 
posterior fusion rods. Discectomy with intervertebral spacer placement at the 
L5-S1 level.     
ALIGNMENT: Leftward curvature of the lumbar spine. Suspected chronic bilateral 
L5 spondylolysis with grade 2 anterolisthesis of L5 on S1. 
VERTEBRAL BODY HEIGHT: Normal.  
MARROW SIGNAL: No focal suspect signal abnormality. 
CORD SIGNAL: Normal distal spinal cord and cauda equina.  Conus terminates at 
L1-L2 level and is normal in signal without pathologic enhancement. 
ADDITIONAL FINDINGS: Bilateral renal parapelvic cysts. 
Modic I-II: None. 
Ligamentum Flavum > 2.5 mm: All nonsurgical levels. 
-------------------------------------------------------------------------------- 
------ 
SEGMENTAL: 
T12-L1: Disc bulge, left facet hypertrophy; no significant central canal 
narrowing.  No significant right neural foraminal narrowing. No significant left 
neural foraminal narrowing.  
L1-L2: Trace disc bulge; no significant central canal narrowing.  No significant 
right neural foraminal narrowing. No significant left neural foraminal 
narrowing.  
L2-L3: Disc bulge eccentric to the left, bilateral facet mild left-sided central 
canal narrowing.  Moderate right neural foraminal narrowing. Mild left neural 
foraminal narrowing.  
L3-L4: Bilateral facet hypertrophy; no significant central canal narrowing.  No 
significant right neural foraminal narrowing. No significant left neural 
foraminal narrowing.  
L4-L5: Disc bulge; no significant central canal narrowing.  No significant right 
neural foraminal narrowing. No significant left neural foraminal narrowing.  
L5-S1: Fusion level, laminectomy, anterolisthesis of L5 on S1 with fusion across 
the disc space: No significant central canal narrowing.  Severe right neural 
foraminal narrowing. Mild left neural foraminal narrowing.  
-------------------------------------------------------------------------------- 
------
IMPRESSION: 1.  Lumbar fusion at L5-S1. 
2.  Discogenic/degenerative change as above. 
3.  No moderate/severe central canal narrowing. 
4.  Moderate right neural foraminal narrowing L2-L3, please correlate for right 
L2 radiculopathy. 
5.  Severe right neural foraminal narrowing L5-S1, please correlate for right L5 
radiculopathy. 
6.  Normal signal in the conus without pathologic enhancement.

## 2023-06-22 IMAGING — MR MRI BRAIN W/WO CONTRAST
2 of 16 series · 4 of 48 positions shown · IV contrast (Gadolinium)
Comparison: None

________________________________________________________________________________________________ 
******** ADDENDUM #1 ********/n 
Addendum: An older study dated April 18, 2003 from [facility] is now 
available. The above described brain signal changes have not significantly 
changed in the interval. 
MRI BRAIN W/WO CONTRAST, 06/22/2023 [DATE]: 
CLINICAL INDICATION: Migraine with Arlind Lindi, not intractable, w/o status 
migrainosus. Multiple myeloma not having achieved remission , history of breast 
cancer. Worsening headaches. History of multiple sclerosis.
TECHNIQUE: Multiplanar, multiecho position MR images of the brain were performed 
without and with 10.5 mL of Gadavist were injected intravenously by hand. 4.5 mL 
of Gadavist discarded. Patient was scanned on a 1.5T magnet.

[Series 1902: T1 post-contrast · coronal · 1.0mm · 0.24mm/px · 3 of 178 slices shown (1 of 2)]
[im 26/178]
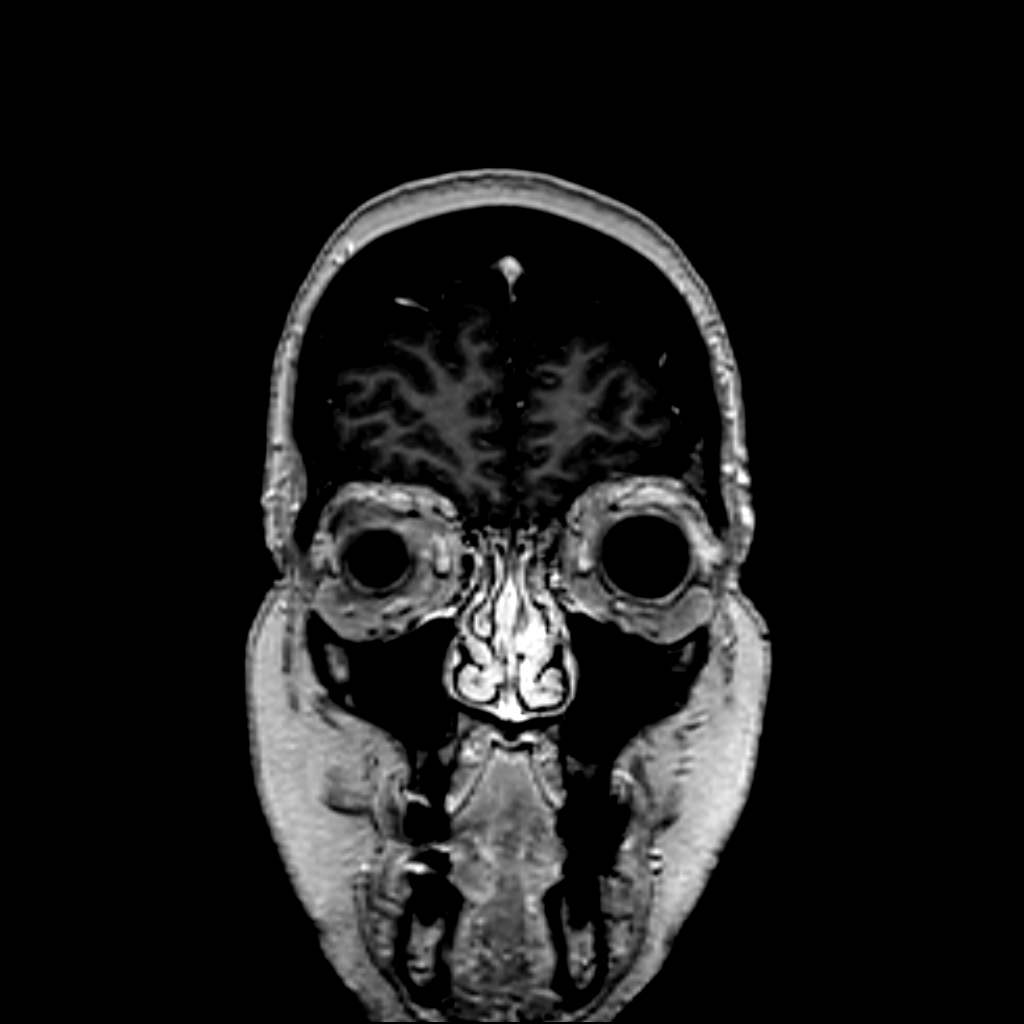
[im 102/178]
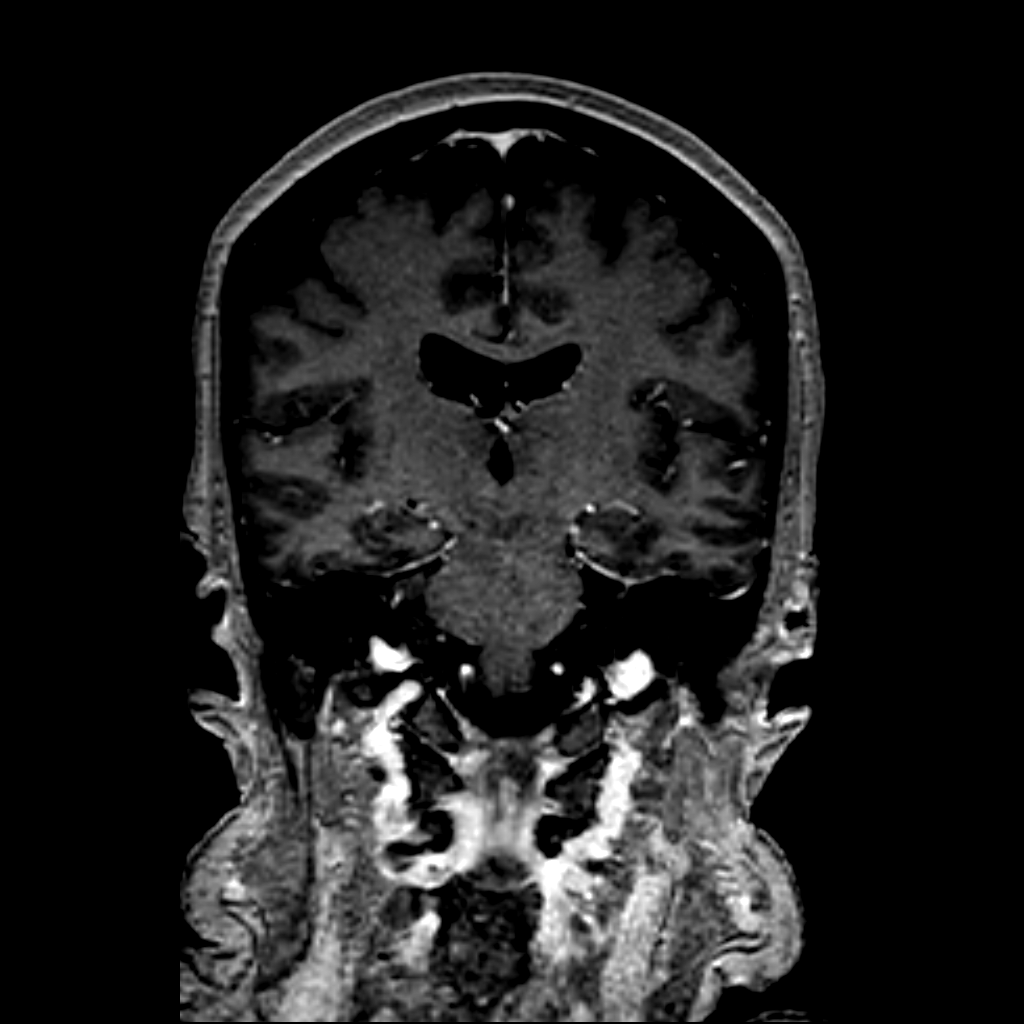
[im 152/178]
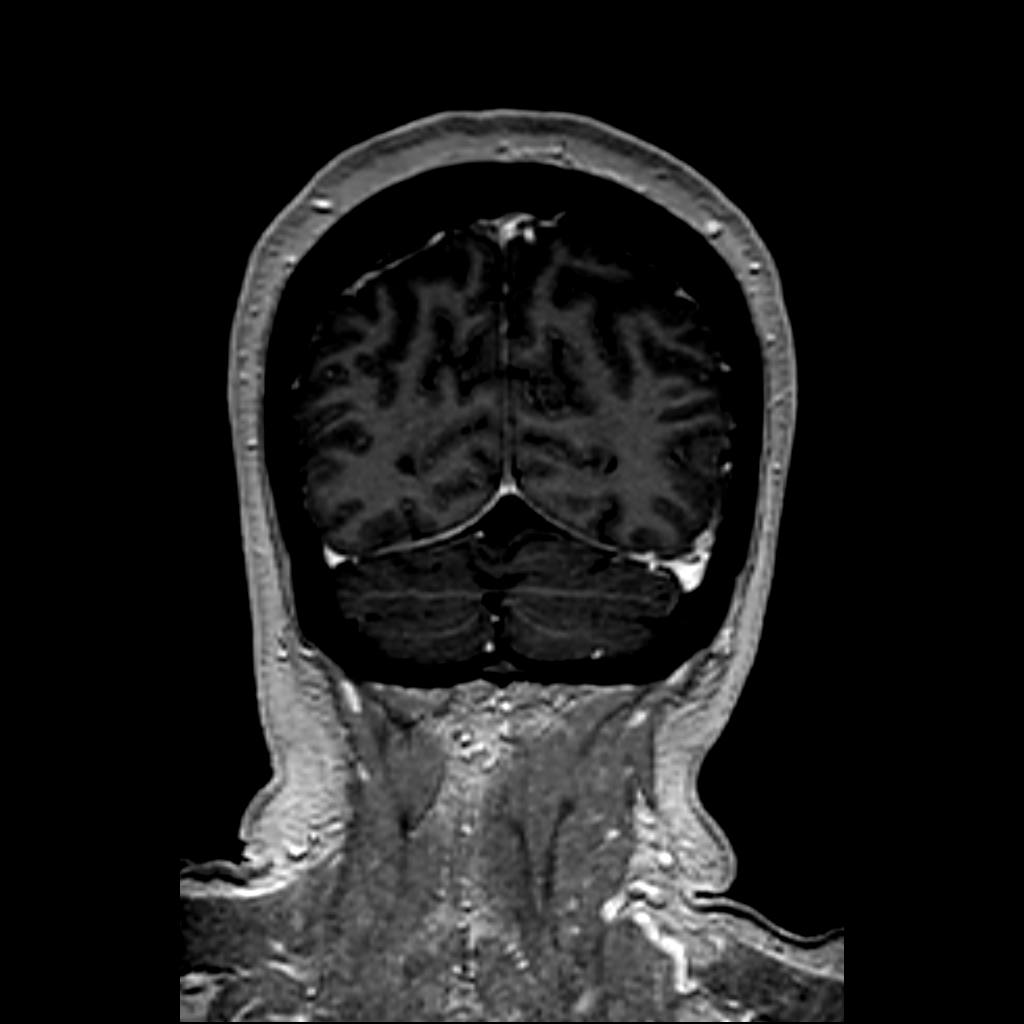

[Series 1903: T1 post-contrast · axial · 1.0mm · 0.24mm/px · 1 of 170 slices shown (2 of 2)]
[im 25/170]
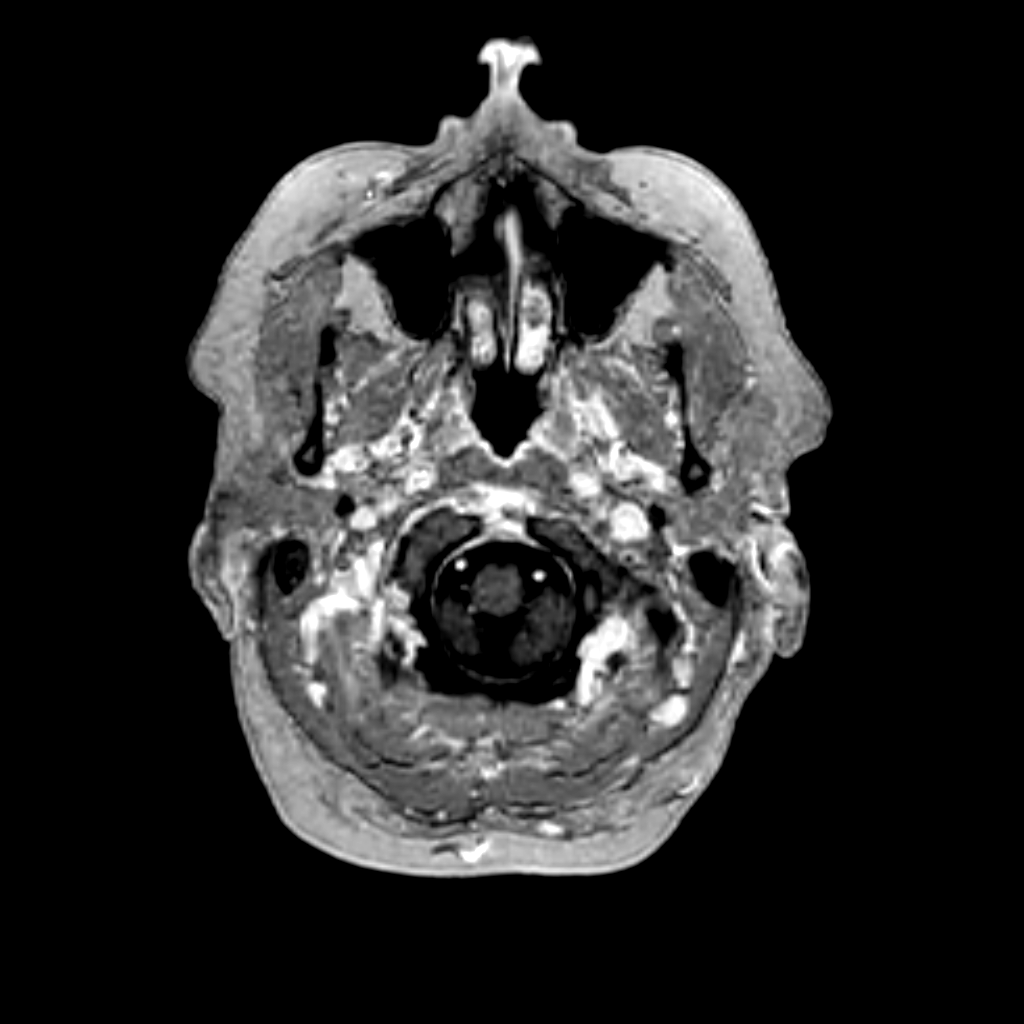

[4 of 48 positions shown; findings below may reference images not displayed]

FINDINGS: -------------------------------------------------------------------------------- 
------------------------- 
INTRACRANIAL: 
-------------------------------------------------------------------------------- 
------------------------- 
MS: 
Supratentorial: 4 T2/FLAIR hyperintensities in cerebral white matter. 
Juxtacortical, periventricular, and deep white matter 
Infratentorial: 0 T2/FLAIR hyperintensities. 
Mass effect: There is no associated mass effect regarding a particular lesion to 
suggest tumefactive lesion. 
Black Holes: 3 lesions have associated near CSF T1 hypointensity to suggest 
black holes. Juxtacortical, periventricular, and deep white matter 
Post Contrast: Enhancing lesions are seen in 0 of the white matter lesions. 
Overall Disease Burden: mild. 
Brain Atrophy: mild 
No acute ischemia. Foci of susceptibility artifact within the frontal lobes 
bilaterally. These may reflect small cavernomas>  Bilateral lentiform nuclear 
perivascular spaces. Patency of the intracranial vascular flow voids.  No acute 
intracranial hemorrhage, mass effect, midline shift. No large sellar mass. No 
hydrocephalus. Cerebral volume is age appropriate.  No pathologic contrast 
enhancement.  
-------------------------------------------------------------------------------- 
----------------------- 
OTHER: 
ORBITS/SINUSES/T-BONES:  Bilateral pseudophakia.  Right mastoid effusion.  
Minimal left maxillary sinus membrane thickening. 
MARROW SIGNAL/SOFT TISSUES: No focal suspect signal abnormality. 
-------------------------------------------------------------------------------- 
-------------------
IMPRESSION: No acute intracranial process. No abnormal intracranial enhancement and no 
evidence of intracranial metastatic disease. 
Mild brain signal changes that would be suggestive of, but not diagnostic of, 
quiescent demyelination. 
Foci of susceptibility artifact in the frontal lobes bilaterally may reflect 
cavernomas.

## 2023-06-24 IMAGING — MR MRI THORACIC SPINE W/WO CONTRAST
5 of 14 series · 9 of 48 positions shown · IV contrast (gadolinium)
Comparison: MRI cervical spine June 24, 2023. MRI lumbar spine June 22, 2023.

________________________________________________________________________________________________ 
MRI THORACIC SPINE W/WO CONTRAST, 06/24/2023 [DATE]: 
CLINICAL INDICATION: History of multiple sclerosis. Right-sided tingling. 
Worsening headaches. History of breast cancer. Previous lumbar fusion.
TECHNIQUE: Multiplanar, multiecho position MR images of the thoracic spine were 
performed without and with intravenous gadolinium enhancement. 10.5 mL of 
Gadavistwere injected intravenously by hand. 4.5 mm were discarded.

[Series 101: t2_sag_count · sagittal · 4.0mm · 0.62mm/px · 2 of 22 slices shown]
[im 1/22]
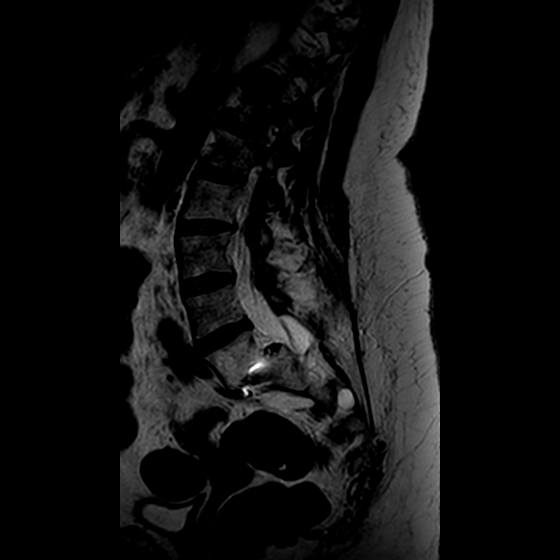
[im 22/22]
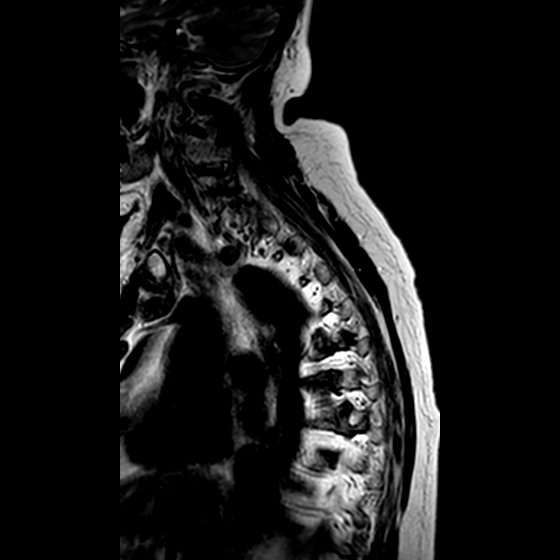

[Series 102: T2 · sagittal · 4.0mm · 0.62mm/px · 1 of 11 slices shown]
[im 1/11]
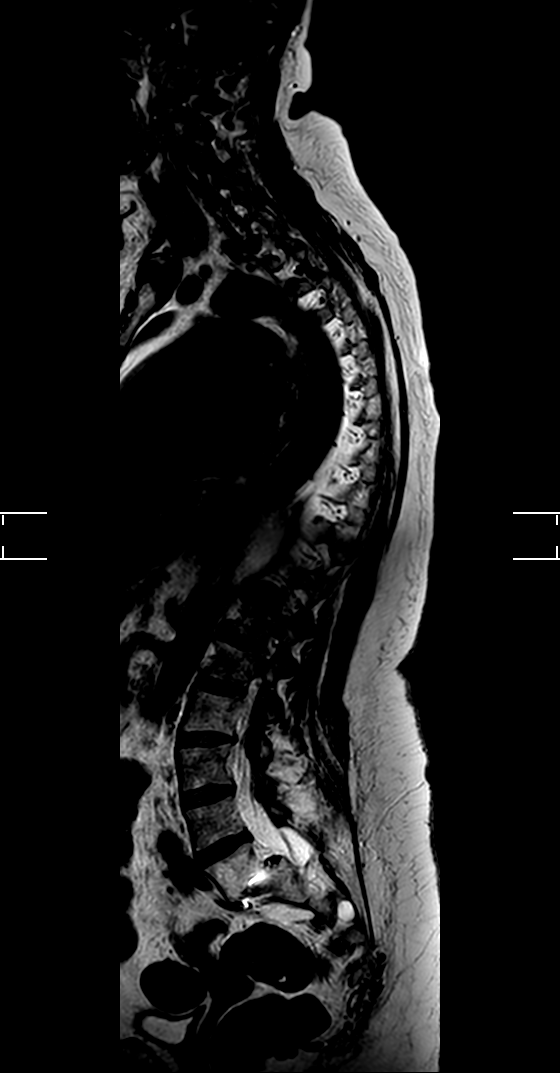

[Series 201: t2_cor_count · coronal · 4.0mm · 0.61mm/px · 1 of 15 slices shown]
[im 1/15]
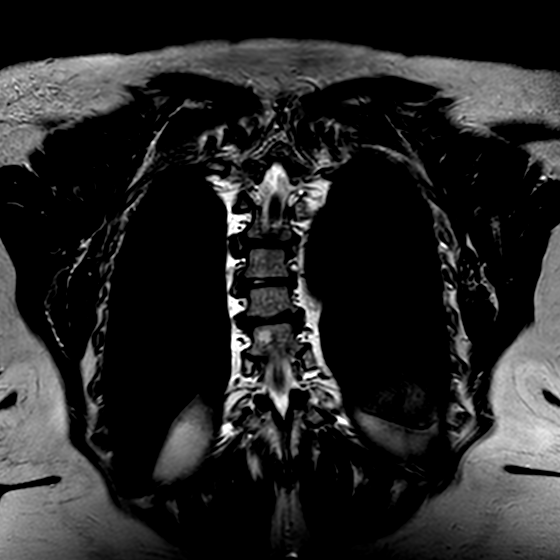

[Series 301: t1_tse_sag · sagittal · 3.0mm · 0.56mm/px · 2 of 21 slices shown]
[im 1/21]
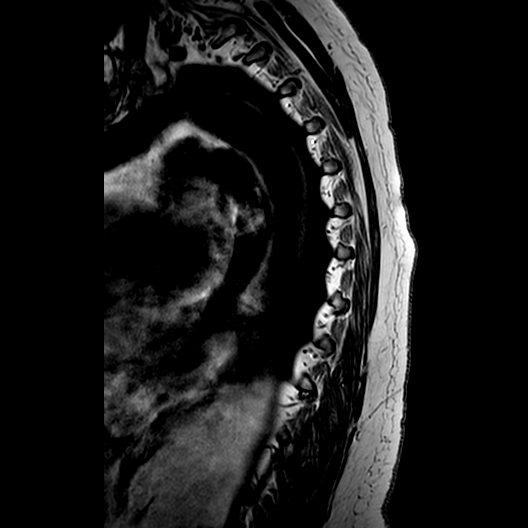
[im 21/21]
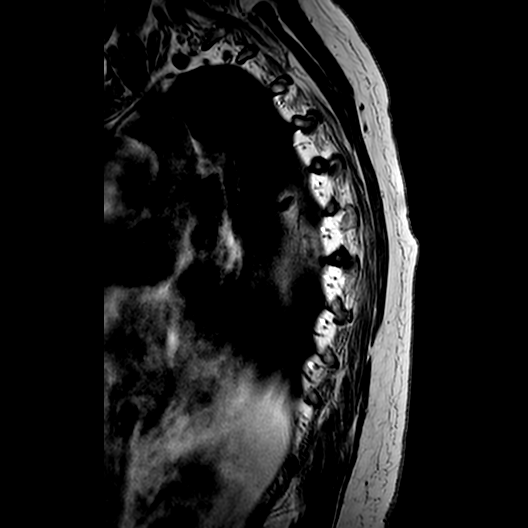

[Series 402: v3d view_t2w tsp · axial · 1.0mm · 0.33mm/px · z∈[-225,-21]mm · 3 of 65 slices shown]
[im 1/65]
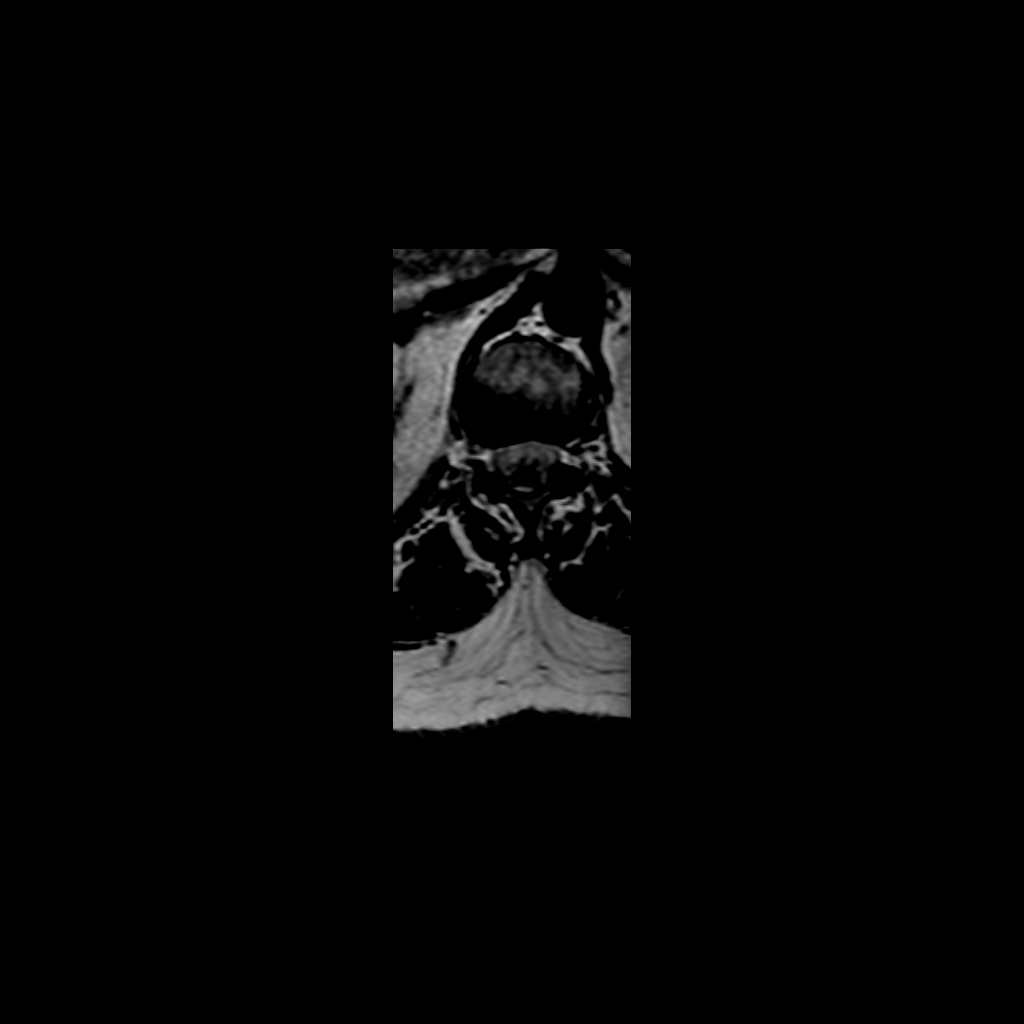
[im 17/65]
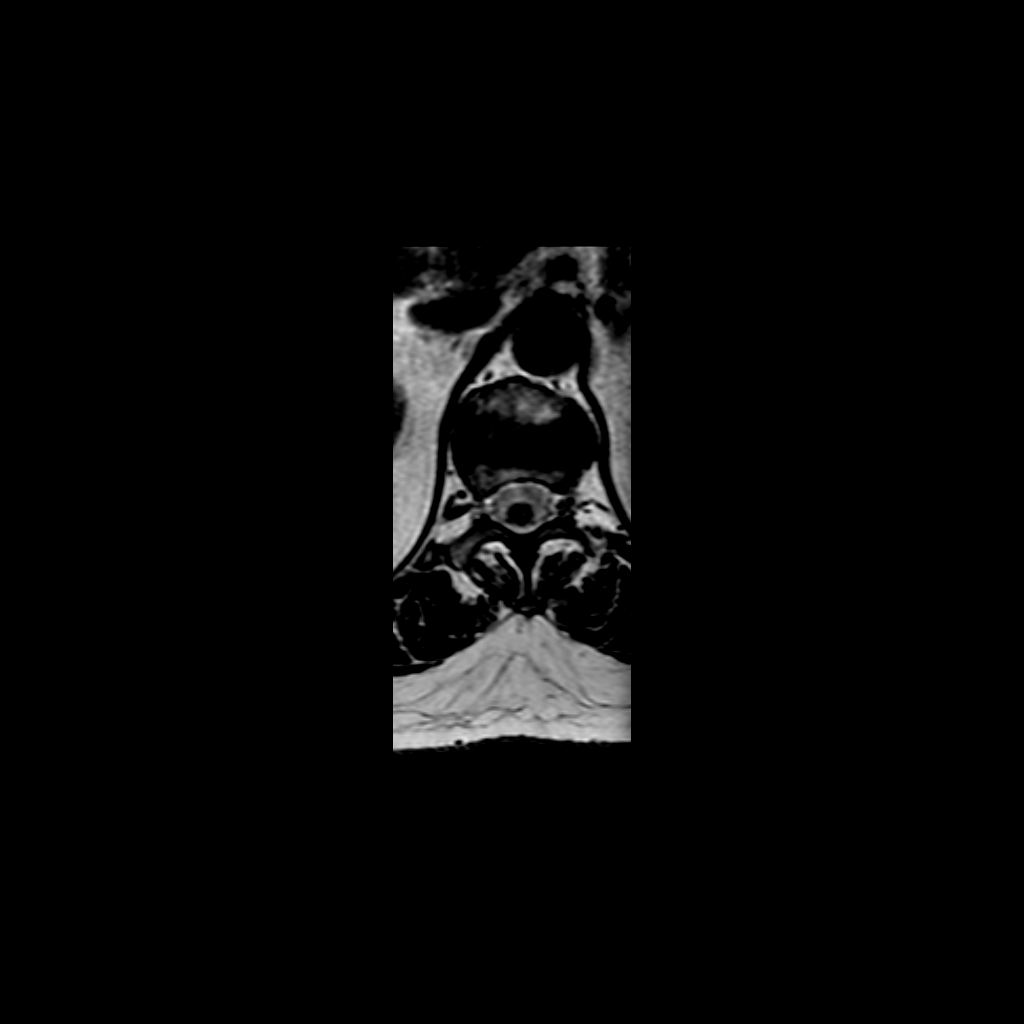
[im 33/65]
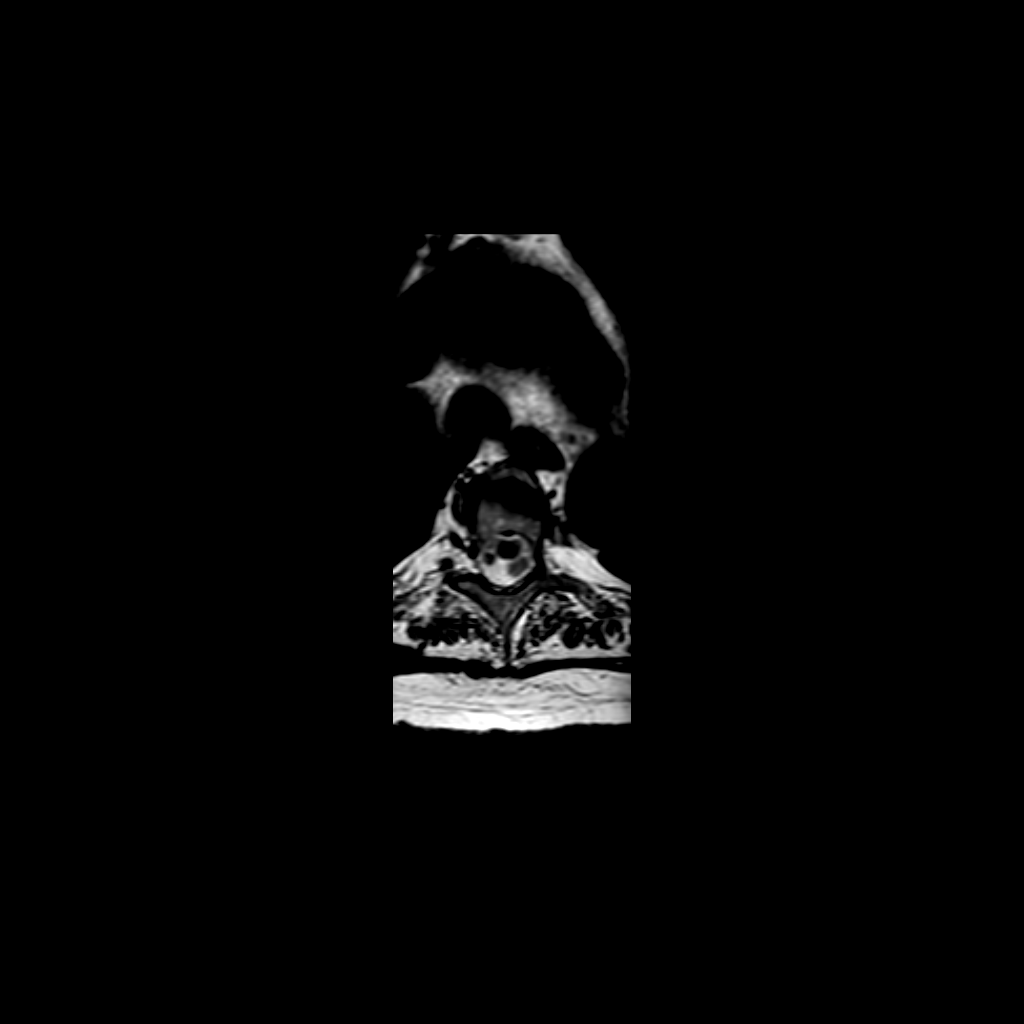

[9 of 48 positions shown; findings below may reference images not displayed]

FINDINGS: Sagittal localizer demonstrates 12 thoracic and 5 lumbar type vertebral bodies. 
Anterior posterior spinal fusion L5-S1 with anterolisthesis L5 on S1. See report 
of dedicated MRI lumbar spine. Sacral Tarlov cyst. 
-------------------------------------------------------------------------------- 
------ 
GENERAL: 
ALIGNMENT: Mild to moderate dextroconvex midthoracic scoliosis. Accentuated 
thoracic kyphosis. Trace grade 1 anterolisthesis C7 on T1, T1 on T2, T2 on T3. 
VERTEBRAL BODY HEIGHT: Normal.  
MARROW SIGNAL: No focal suspect signal abnormality. 
CORD SIGNAL: Subtle abnormal cord signal in the distal cervical cord. Please see 
dedicated MRI cervical spine for better characterization. No abnormal cord 
signal or enhancement within the thoracic cord. 
ADDITIONAL FINDINGS: Mild elevation left hemidiaphragm.. 
-------------------------------------------------------------------------------- 
------ 
RELEVANT SEGMENTAL (levels with severe stenosis or significant findings): 
T1-T2: Central disc extrusion is small in size and creates only borderline canal 
stenosis without ventral cord distortion. Foramina patent. 
T3-T4: Small central disc protrusion minimally indents the ventral cord margin 
with borderline canal stenosis. 
Additional scattered discogenic/degenerative changes are noted. 
-------------------------------------------------------------------------------- 
------
IMPRESSION: No abnormal cord signal or enhancement within the thoracic cord. 
Accentuated thoracic kyphosis with other mild thoracic degenerative changes 
detailed above.

## 2023-06-24 IMAGING — MR MRI CERVICAL SPINE W/WO CONTRAST
6 of 10 series · 25 of 48 positions shown · IV contrast (gadavist)
Comparison: MRI thoracic spine June 24, 2023. MRI lumbar spine June 22, 2023.

________________________________________________________________________________________________ 
MRI CERVICAL SPINE W/WO CONTRAST, 06/24/2023 [DATE]: 
CLINICAL INDICATION: Migraine with Sarfaraz, not intractable, w/o status 
migrainosus. , New onset right-sided tingling. Worsening headaches. History of 
breast cancer. Previous lumbar fusion. History of multiple sclerosis.
TECHNIQUE: Multiplanar, multiecho position MR images of the cervical spine were 
performed without and with 10.5 mL of Gadavist injected intravenously by hand. 
4.5 mL of Gadavist discarded.

[Series 801: t2w_cor-surv · coronal · 5.0mm · 0.69mm/px · 3 of 7 slices shown]
[im 1/7]
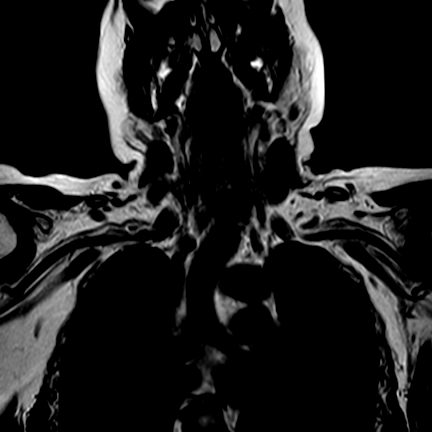
[im 4/7]
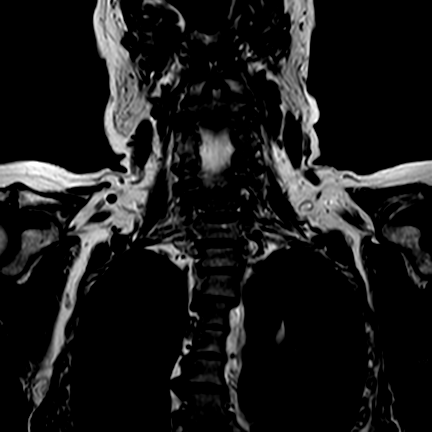
[im 7/7]
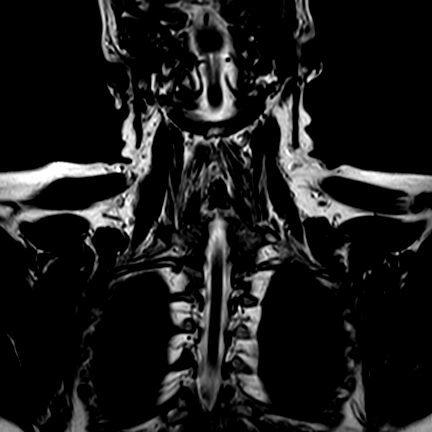

[Series 901: t1_sag · sagittal · 3.0mm · 0.39mm/px · 3 of 15 slices shown]
[im 1/15]
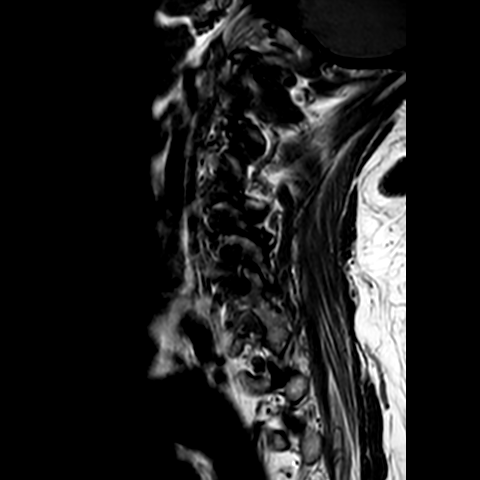
[im 8/15]
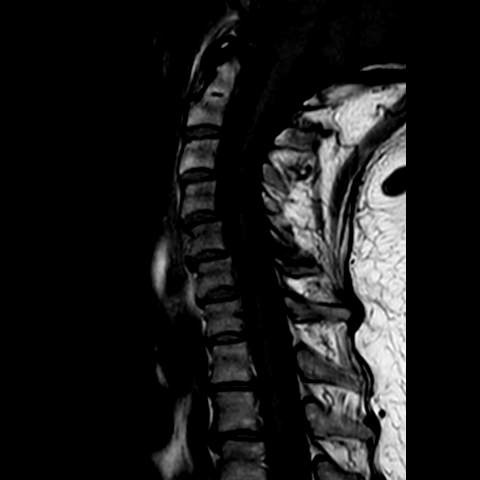
[im 15/15]
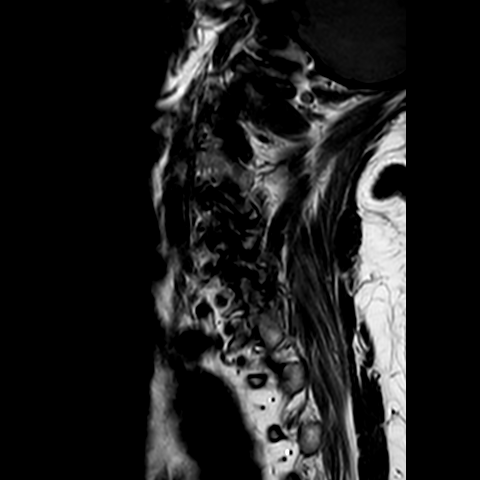

[Series 1002: (id)_mdixon_tse · sagittal · 3.0mm · 0.42mm/px · 3 of 15 slices shown]
[im 1/15]
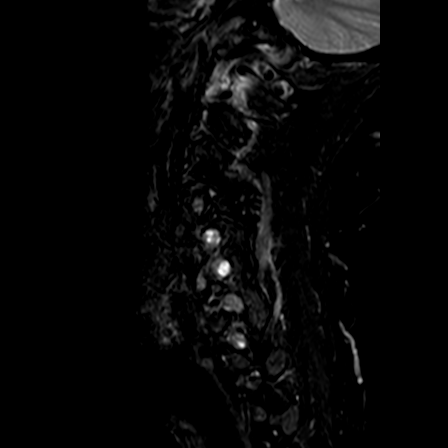
[im 8/15]
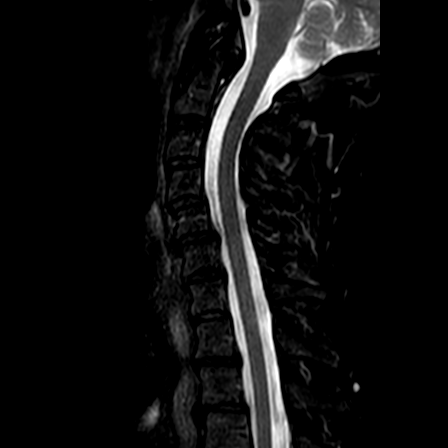
[im 15/15]
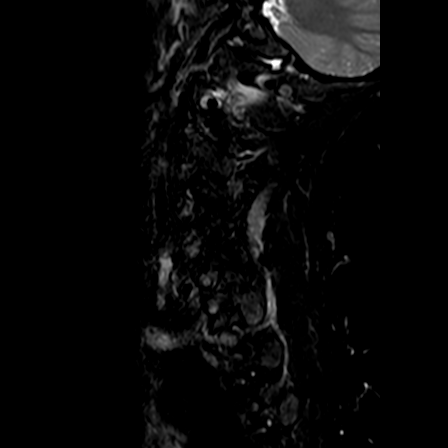

[Series 1003: st2w_mdixon_tse · sagittal · 3.0mm · 0.42mm/px · 3 of 15 slices shown]
[im 1/15]
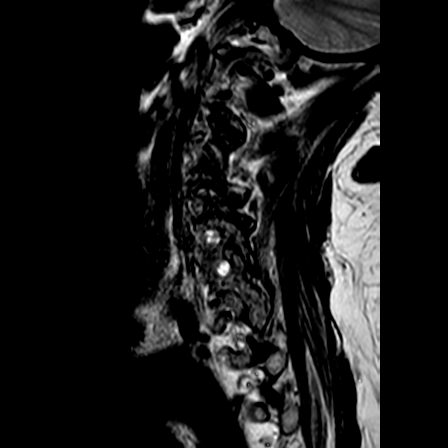
[im 8/15]
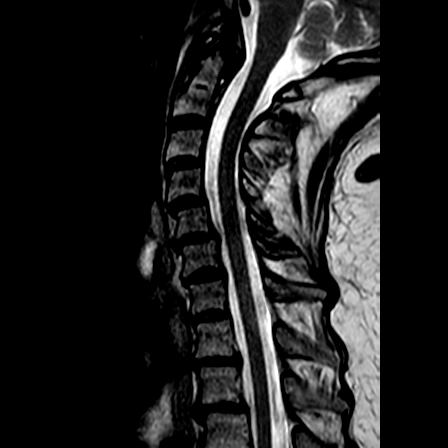
[im 15/15]
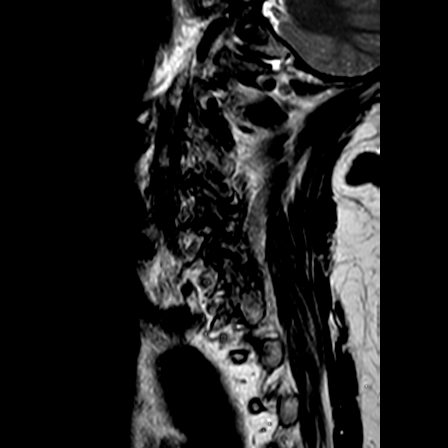

[Series 1101: t2_ffe ax · axial · 3.0mm · 0.62mm/px · z∈[-27,+47]mm · 6 of 34 slices shown]
[im 1/34]
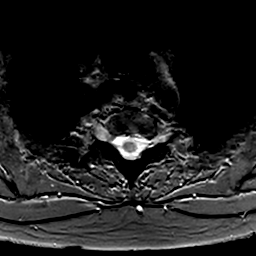
[im 6/34]
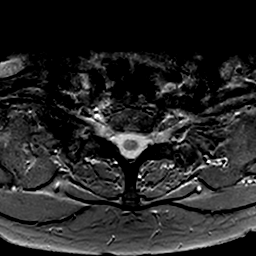
[im 12/34]
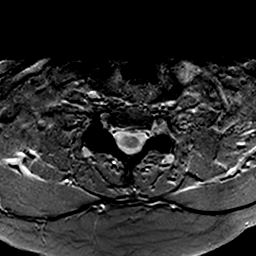
[im 17/34]
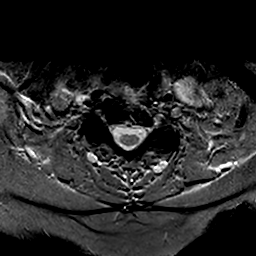
[im 23/34]
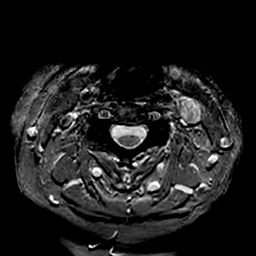
[im 28/34]
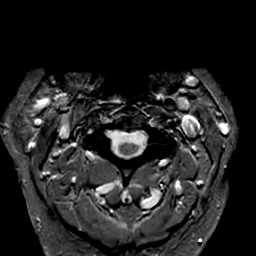

[Series 1201: T2 · axial · 3.0mm · 0.31mm/px · z∈[-25,+60]mm · 7 of 32 slices shown]
[im 1/32]
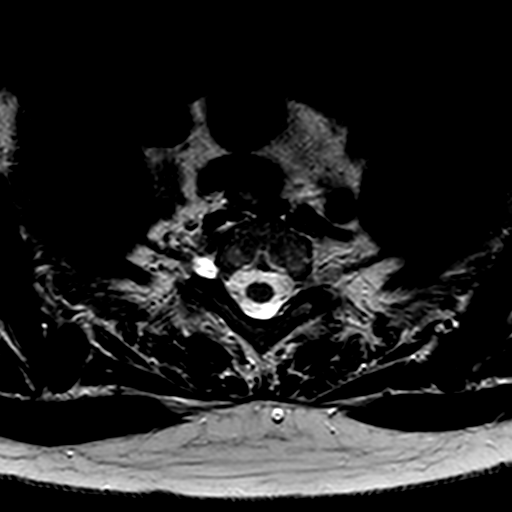
[im 6/32]
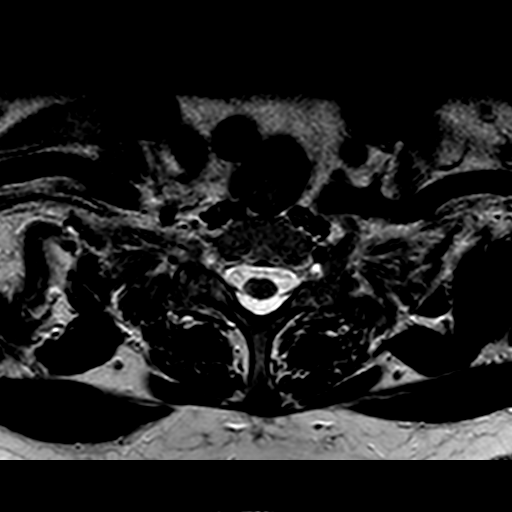
[im 11/32]
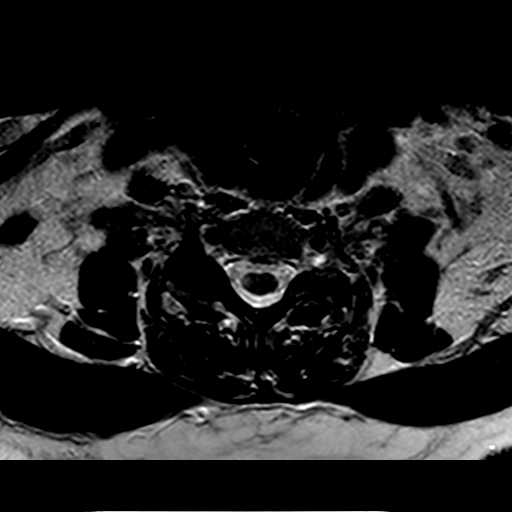
[im 16/32]
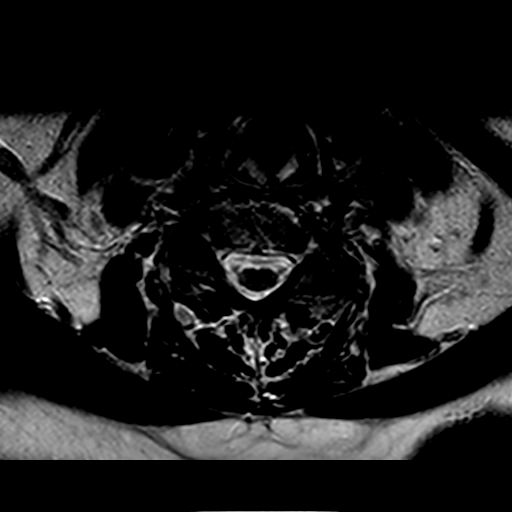
[im 21/32]
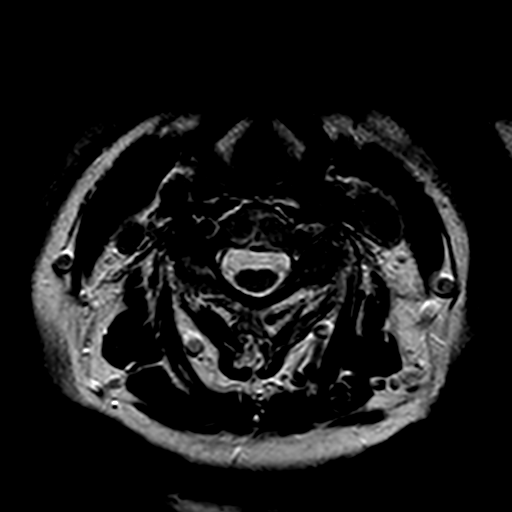
[im 26/32]
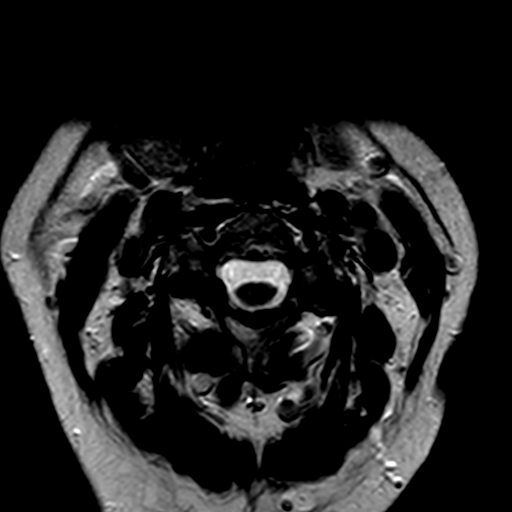
[im 32/32]
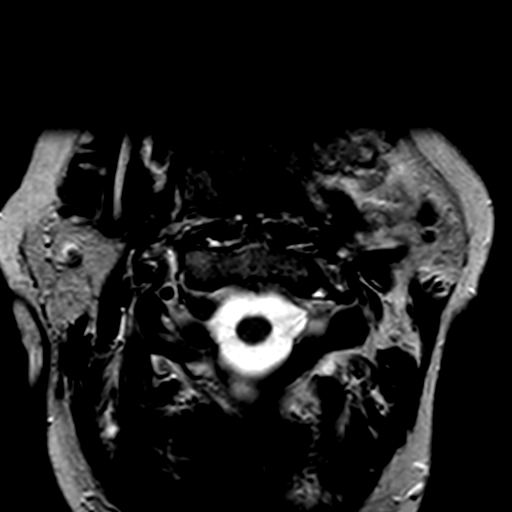

[25 of 48 positions shown; findings below may reference images not displayed]

FINDINGS: -------------------------------------------------------------------------------- 
----------------- 
GENERAL: 
ALIGNMENT: Mild levoconvex cervical thoracic scoliosis with mild dextroconvex 
mid thoracic scoliosis. Grade 1 anterolisthesis C3 on C4 and C4 on C5 and C5 on 
C6 and C6 on C7. Grade 1 anterolisthesis T1 on T2 and T2 on T3. 
VERTEBRAL BODY HEIGHT: Normal.  
MARROW SIGNAL: No focal suspect signal abnormality. 
CORD SIGNAL: There is nonenhancing abnormal cord signal within the right lateral 
hemicord most prominent at the superior C6 level. This extends inferiorly within 
the right lateral hemicord to the superior C7 level. There also appears to be 
subtle abnormal cord signal which is in the left hemicord at the superior C7 
level. No enhancement. No cord atrophy or expansion. 
ADDITIONAL FINDINGS: None. 
-------------------------------------------------------------------------------- 
---------------- 
SEGMENTAL:  
CRANIOCERVICAL JUNCTION: No significant stenosis. 
C2-C3: Facet arthropathy. Otherwise unremarkable. 
C3-C4: Posterior loss of disc height. Mild right-sided uncinate spurring with 
borderline right foraminal narrowing. Facet arthropathy. Canal and left foramen 
are patent. 
C4-C5: Posterior loss of disc height. Bilateral uncinate spurring with facet 
arthropathy although the canal and foramina are patent. 
C5-C6: Loss of disc height with disc uncovering and borderline canal stenosis. 
Bilateral uncinate spurring and facet arthropathy. Mild to moderate right 
foraminal narrowing. Left foramen is patent. 
C6-C7: Normal disc height. Small central disc osteophyte protrusion. Canal and 
foramina are patent with right-sided facet arthropathy. 
C7-T1: Normal disc height. No herniation. Normal facets. No spinal canal or 
neural foraminal stenosis. 
T1-T2: Normal disc height. Central disc extrusion with borderline canal 
stenosis. Foramina are patent. 
-------------------------------------------------------------------------------- 
---------------
IMPRESSION: Nonenhancing CORD signal changes within the lower cervical cord to be suggestive 
of, but not diagnostic of, quiescent demyelination. 
Mild cervical spondylosis but without significant canal stenosis. Most 
significant foraminal narrowing is on the right at C5-C6, mild-to-moderate.
